# Patient Record
Sex: Female | Born: 1985 | Hispanic: Yes | Marital: Married | Smoking: Never smoker
Health system: Southern US, Community
[De-identification: ages and names within clinical notes are randomized; demographics above are authoritative.]

## PROBLEM LIST (undated history)

## (undated) ENCOUNTER — Inpatient Hospital Stay (HOSPITAL_COMMUNITY): Payer: Self-pay

## (undated) DIAGNOSIS — Z789 Other specified health status: Secondary | ICD-10-CM

## (undated) HISTORY — DX: Other specified health status: Z78.9

## (undated) HISTORY — PX: NO PAST SURGERIES: SHX2092

---

## 2018-02-16 ENCOUNTER — Other Ambulatory Visit: Payer: Self-pay

## 2018-02-16 ENCOUNTER — Inpatient Hospital Stay (HOSPITAL_COMMUNITY): Payer: Self-pay

## 2018-02-16 ENCOUNTER — Inpatient Hospital Stay (HOSPITAL_COMMUNITY)
Admission: AD | Admit: 2018-02-16 | Discharge: 2018-02-16 | Disposition: A | Discharge status: Home / Self Care | Payer: Self-pay | Source: Ambulatory Visit | Attending: Obstetrics & Gynecology | Admitting: Obstetrics & Gynecology

## 2018-02-16 ENCOUNTER — Encounter (HOSPITAL_COMMUNITY): Payer: Self-pay

## 2018-02-16 DIAGNOSIS — Z3A01 Less than 8 weeks gestation of pregnancy: Secondary | ICD-10-CM | POA: Insufficient documentation

## 2018-02-16 DIAGNOSIS — O209 Hemorrhage in early pregnancy, unspecified: Secondary | ICD-10-CM

## 2018-02-16 DIAGNOSIS — O26851 Spotting complicating pregnancy, first trimester: Secondary | ICD-10-CM | POA: Insufficient documentation

## 2018-02-16 LAB — URINALYSIS, ROUTINE W REFLEX MICROSCOPIC
BILIRUBIN URINE: NEGATIVE
Glucose, UA: NEGATIVE mg/dL
Ketones, ur: NEGATIVE mg/dL
Leukocytes, UA: NEGATIVE
NITRITE: NEGATIVE
PROTEIN: NEGATIVE mg/dL
Specific Gravity, Urine: 1.001 — ABNORMAL LOW (ref 1.005–1.030)
pH: 7 (ref 5.0–8.0)

## 2018-02-16 LAB — WET PREP, GENITAL
Clue Cells Wet Prep HPF POC: NONE SEEN
SPERM: NONE SEEN
TRICH WET PREP: NONE SEEN
YEAST WET PREP: NONE SEEN

## 2018-02-16 LAB — POCT PREGNANCY, URINE: PREG TEST UR: POSITIVE — AB

## 2018-02-16 LAB — HCG, QUANTITATIVE, PREGNANCY: hCG, Beta Chain, Quant, S: 55620 m[IU]/mL — ABNORMAL HIGH (ref <5)

## 2018-02-16 LAB — ABO/RH: ABO/RH(D): O POS

## 2018-02-16 NOTE — MAU Provider Note (Signed)
History   G1 @ apx 6 wks unsure of dates states periods are not regular. Admitted with c/o spotty amt dark vag bleeding since yesterday. States minimal amt of abd discomfort.  CSN: [id number]  Arrival date & time 02/16/18  1127   None     No chief complaint on file.   HPI  No past medical history on file.   No family history on file.  Social History   Tobacco Use  . Smoking status: Not on file  Substance Use Topics  . Alcohol use: Not on file  . Drug use: Not on file    OB History   None     Review of Systems  Constitutional: Negative.   HENT: Negative.   Eyes: Negative.   Respiratory: Negative.   Cardiovascular: Negative.   Gastrointestinal: Negative.   Endocrine: Negative.   Genitourinary: Positive for vaginal bleeding.  Musculoskeletal: Negative.   Skin: Negative.   Allergic/Immunologic: Negative.   Neurological: Negative.   Hematological: Negative.   Psychiatric/Behavioral: Negative.     Allergies  Patient has no known allergies.  Home Medications    There were no vitals taken for this visit.  Physical Exam  Constitutional: She is oriented to person, place, and time. She appears well-developed and well-nourished.  HENT:  Head: Normocephalic.  Eyes: Pupils are equal, round, and reactive to light.  Neck: Normal range of motion.  Cardiovascular: Normal rate, regular rhythm, normal heart sounds and intact distal pulses.  Pulmonary/Chest: Effort normal and breath sounds normal.  Abdominal: Soft. Bowel sounds are normal.  Genitourinary: Vagina normal and uterus normal.  Genitourinary Comments: Spotty amt vag bleeding  Musculoskeletal: Normal range of motion.  Neurological: She is alert and oriented to person, place, and time. She has normal reflexes.  Skin: Skin is warm and dry.  Psychiatric: She has a normal mood and affect. Her behavior is normal. Judgment and thought content normal.    MAU Course  Procedures (including critical care  time)  Labs Reviewed  POCT PREGNANCY, URINE - Abnormal; Notable for the following components:      Result Value   Preg Test, Ur POSITIVE (*)    All other components within normal limits  WET PREP, GENITAL  URINALYSIS, ROUTINE W REFLEX MICROSCOPIC  HCG, QUANTITATIVE, PREGNANCY  ABO/RH  GC/CHLAMYDIA PROBE AMP ([place]) NOT AT [facility]   No results found. Results for orders placed or performed during the hospital encounter of 02/16/18 (from the past 24 hour(s))  Pregnancy, urine POC     Status: Abnormal   Collection Time: 02/16/18 11:45 AM  Result Value Ref Range   Preg Test, Ur POSITIVE (A) NEGATIVE  Wet prep, genital     Status: Abnormal   Collection Time: 02/16/18 12:00 PM  Result Value Ref Range   Yeast Wet Prep HPF POC NONE SEEN NONE SEEN   Trich, Wet Prep NONE SEEN NONE SEEN   Clue Cells Wet Prep HPF POC NONE SEEN NONE SEEN   WBC, Wet Prep HPF POC FEW (A) NONE SEEN   Sperm NONE SEEN   ABO/Rh     Status: None (Preliminary result)   Collection Time: 02/16/18 12:02 PM  Result Value Ref Range   ABO/RH(D)      O POS Performed at [facility], [address] [place], [place] [address]   hCG, quantitative, pregnancy     Status: Abnormal   Collection Time: 02/16/18 12:02 PM  Result Value Ref Range   hCG, Beta Chain, Quant, S 55,620 (  H) <5 mIU/mL   No diagnosis found.    MDM  VSS, spotty amt vag bleeding on exam. abd soft and non tender. [place] shows viable IUP at 6.3 wks. U/a WNL. Will d/c home

## 2018-02-16 NOTE — MAU Note (Signed)
Pt. Complains of abdominal pain with vaginal bleeding when she wipes.  Blood is dark red.  Pt. Rates abdominal pain as 5/10.

## 2018-02-18 LAB — GC/CHLAMYDIA PROBE AMP (~~LOC~~) NOT AT ARMC
Chlamydia: NEGATIVE
NEISSERIA GONORRHEA: NEGATIVE

## 2018-04-17 ENCOUNTER — Encounter: Payer: Self-pay | Admitting: Medical

## 2018-04-19 ENCOUNTER — Other Ambulatory Visit: Payer: Self-pay | Admitting: Obstetrics and Gynecology

## 2018-04-19 ENCOUNTER — Ambulatory Visit (INDEPENDENT_AMBULATORY_CARE_PROVIDER_SITE_OTHER): Payer: Self-pay | Admitting: Obstetrics and Gynecology

## 2018-04-19 ENCOUNTER — Encounter: Payer: Self-pay | Admitting: Obstetrics and Gynecology

## 2018-04-19 VITALS — BP 124/65 | HR 71 | Wt 173.4 lb

## 2018-04-19 DIAGNOSIS — Z3400 Encounter for supervision of normal first pregnancy, unspecified trimester: Secondary | ICD-10-CM | POA: Insufficient documentation

## 2018-04-19 DIAGNOSIS — O99612 Diseases of the digestive system complicating pregnancy, second trimester: Secondary | ICD-10-CM

## 2018-04-19 DIAGNOSIS — Z113 Encounter for screening for infections with a predominantly sexual mode of transmission: Secondary | ICD-10-CM

## 2018-04-19 DIAGNOSIS — Z9889 Other specified postprocedural states: Secondary | ICD-10-CM | POA: Insufficient documentation

## 2018-04-19 DIAGNOSIS — O99619 Diseases of the digestive system complicating pregnancy, unspecified trimester: Principal | ICD-10-CM

## 2018-04-19 DIAGNOSIS — Z3402 Encounter for supervision of normal first pregnancy, second trimester: Secondary | ICD-10-CM

## 2018-04-19 DIAGNOSIS — K219 Gastro-esophageal reflux disease without esophagitis: Secondary | ICD-10-CM

## 2018-04-19 LAB — POCT URINALYSIS DIP (DEVICE)
Bilirubin Urine: NEGATIVE
Glucose, UA: NEGATIVE mg/dL
Leukocytes, UA: NEGATIVE
Nitrite: NEGATIVE
PH: 6 (ref 5.0–8.0)
PROTEIN: NEGATIVE mg/dL
Specific Gravity, Urine: 1.02 (ref 1.005–1.030)
Urobilinogen, UA: 0.2 mg/dL (ref 0.0–1.0)

## 2018-04-19 MED ORDER — RANITIDINE HCL 150 MG PO TABS: 150.0000 mg | ORAL_TABLET | Freq: Two times a day (BID) | ORAL | 0 refills | Status: DC

## 2018-04-19 NOTE — Progress Notes (Signed)
  Subjective:    Katelyn [name] is being seen today for her first obstetrical visit.  This is a planned pregnancy. She is at [redacted]w[redacted]d gestation. Her obstetrical history is significant for h/o colpo & LEEP (in January 2019). Relationship with FOB: spouse, living together. Patient does intend to breast feed. Pregnancy history fully reviewed.  Patient reports heartburn. She reports N/V is now improved.  Review of Systems:   Review of Systems  Constitutional: Negative.   HENT: Negative.   Eyes: Negative.   Respiratory: Negative.   Cardiovascular: Negative.   Gastrointestinal:       Heartburn  Endocrine: Negative.   Genitourinary: Negative.   Musculoskeletal: Negative.   Skin: Negative.   Allergic/Immunologic: Negative.   Neurological: Negative.   Hematological: Negative.   Psychiatric/Behavioral: Negative.     Objective:     BP 124/65   Pulse 71   Wt 173 lb 6.4 oz (78.7 kg)   LMP 12/28/2017   BMI 30.72 kg/m  Physical Exam  Nursing note and vitals reviewed. Constitutional: She is oriented to person, place, and time. She appears well-developed and well-nourished.  HENT:  Head: Normocephalic and atraumatic.  Eyes: Pupils are equal, round, and reactive to light.  Neck: Normal range of motion. Neck supple.  Cardiovascular: Normal rate, regular rhythm, normal heart sounds and intact distal pulses.  Respiratory: Effort normal and breath sounds normal.  GI: Soft. Bowel sounds are normal.  Neurological: She is alert and oriented to person, place, and time. She has normal reflexes.  Skin: Skin is warm and dry.  Psychiatric: She has a normal mood and affect. Her behavior is normal. Judgment and thought content normal.    Maternal Exam:  Abdomen: Patient reports no abdominal tenderness. Introitus: Normal vulva. Normal vagina.  Pelvis: adequate for delivery.   Cervix: Cervix evaluated by sterile speculum exam and digital exam.     Fetal Exam Fetal Monitor Review: Mode:  hand-held doppler probe.   Baseline rate: 153.         Assessment:    Pregnancy: G1P0000 Patient Active Problem List   Diagnosis Date Noted  . Supervision of low-risk first pregnancy 04/19/2018  . History of loop electrical excision procedure (LEEP) 04/19/2018  . History of colposcopy with cervical biopsy 04/19/2018       Plan:     Initial labs drawn. Prenatal vitamins. Problem list reviewed and updated. Plan repeat pap in July. Patient to get pap, colpo & LEEP records from [place].  Panorama & AFP3 discussed: wants to speak to spouse first. Role of ultrasound in pregnancy discussed; fetal survey: ordered. To be performed at [facility]. Patient is an [employer] participant. Amniocentesis discussed: not indicated. Follow up in 4 weeks. Setup with Babyscripts. 90% of 45 min visit spent on counseling and coordination of care.   **History, assessment, exam, lab work, and discharge home all done with in person Medical Spanish Interpreter present.    Raelyn Mora, MSN, CNM 04/19/2018

## 2018-04-22 LAB — GC/CHLAMYDIA PROBE AMP (~~LOC~~) NOT AT ARMC
CHLAMYDIA, DNA PROBE: NEGATIVE
NEISSERIA GONORRHEA: NEGATIVE

## 2018-04-22 LAB — URINE CULTURE, OB REFLEX

## 2018-04-22 LAB — CULTURE, OB URINE

## 2018-04-24 ENCOUNTER — Encounter: Payer: Self-pay | Admitting: *Deleted

## 2018-04-25 LAB — OBSTETRIC PANEL, INCLUDING HIV
ANTIBODY SCREEN: NEGATIVE
BASOS ABS: 0 10*3/uL (ref 0.0–0.2)
BASOS: 0 %
EOS (ABSOLUTE): 0 10*3/uL (ref 0.0–0.4)
Eos: 0 %
HEMATOCRIT: 37.1 % (ref 34.0–46.6)
HIV Screen 4th Generation wRfx: NONREACTIVE
Hemoglobin: 12.6 g/dL (ref 11.1–15.9)
Hepatitis B Surface Ag: NEGATIVE
IMMATURE GRANS (ABS): 0 10*3/uL (ref 0.0–0.1)
IMMATURE GRANULOCYTES: 0 %
LYMPHS: 18 %
Lymphocytes Absolute: 1.9 10*3/uL (ref 0.7–3.1)
MCH: 28.2 pg (ref 26.6–33.0)
MCHC: 34 g/dL (ref 31.5–35.7)
MCV: 83 fL (ref 79–97)
MONOCYTES: 7 %
MONOS ABS: 0.7 10*3/uL (ref 0.1–0.9)
NEUTROS PCT: 75 %
Neutrophils Absolute: 8.1 10*3/uL — ABNORMAL HIGH (ref 1.4–7.0)
Platelets: 274 10*3/uL (ref 150–450)
RBC: 4.47 x10E6/uL (ref 3.77–5.28)
RDW: 13.6 % (ref 12.3–15.4)
RPR Ser Ql: NONREACTIVE
RUBELLA: 20.7 {index} (ref >0.99)
Rh Factor: POSITIVE
WBC: 10.8 10*3/uL (ref 3.4–10.8)

## 2018-04-25 LAB — HEMOGLOBINOPATHY EVALUATION
FERRITIN: 225 ng/mL — AB (ref 15–150)
HGB SOLUBILITY: NEGATIVE
HGB VARIANT: 0 %
Hgb A2 Quant: 2.4 % (ref 1.8–3.2)
Hgb A: 97.6 % (ref 96.4–98.8)
Hgb C: 0 %
Hgb F Quant: 0 % (ref 0.0–2.0)
Hgb S: 0 %

## 2018-04-25 LAB — SMN1 COPY NUMBER ANALYSIS (SMA CARRIER SCREENING)

## 2018-04-26 ENCOUNTER — Encounter: Payer: Self-pay | Admitting: *Deleted

## 2018-05-10 ENCOUNTER — Telehealth: Payer: Self-pay | Admitting: General Practice

## 2018-05-10 NOTE — Telephone Encounter (Signed)
Per review, patient has not activated BRX yet. Called patient with pacific interpreter [phone], no answer- left message stating we are calling because we see she hasn't activated her app yet and want to make sure she isn't having problems with it. Please call us back.

## 2018-05-17 ENCOUNTER — Encounter: Payer: Self-pay | Admitting: Nurse Practitioner

## 2018-05-24 DIAGNOSIS — Z3400 Encounter for supervision of normal first pregnancy, unspecified trimester: Secondary | ICD-10-CM

## 2018-05-30 ENCOUNTER — Ambulatory Visit (INDEPENDENT_AMBULATORY_CARE_PROVIDER_SITE_OTHER): Payer: Self-pay | Admitting: Nurse Practitioner

## 2018-05-30 VITALS — BP 112/58 | HR 67 | Wt 179.7 lb

## 2018-05-30 DIAGNOSIS — Z124 Encounter for screening for malignant neoplasm of cervix: Secondary | ICD-10-CM

## 2018-05-30 DIAGNOSIS — Z9889 Other specified postprocedural states: Secondary | ICD-10-CM

## 2018-05-30 DIAGNOSIS — Z789 Other specified health status: Secondary | ICD-10-CM | POA: Insufficient documentation

## 2018-05-30 DIAGNOSIS — Z3402 Encounter for supervision of normal first pregnancy, second trimester: Secondary | ICD-10-CM

## 2018-05-30 DIAGNOSIS — Z1151 Encounter for screening for human papillomavirus (HPV): Secondary | ICD-10-CM

## 2018-05-30 DIAGNOSIS — Z113 Encounter for screening for infections with a predominantly sexual mode of transmission: Secondary | ICD-10-CM

## 2018-05-30 NOTE — Progress Notes (Signed)
    Subjective:  Katelyn [name] is a 32 y.o. G1P0000 at 7070w6d being seen today for ongoing prenatal care.  She is currently monitored for the following issues for this low-risk pregnancy and has Supervision of low-risk first pregnancy; History of loop electrical excision procedure (LEEP); and History of colposcopy with cervical biopsy on their problem list. #[id number] Kennyth Lose- [place] phone interpreter used for the interview and exam.  Patient reports no complaints.  Contractions: Not present. Vag. Bleeding: None.  Movement: Present. Denies leaking of fluid.   The following portions of the patient's history were reviewed and updated as appropriate: allergies, current medications, past family history, past medical history, past social history, past surgical history and problem list. Problem list updated.  Objective:   Vitals:   05/30/18 1130  BP: (!) 112/58  Pulse: 67  Weight: 179 lb 11.2 oz (81.5 kg)    Fetal Status: Fetal Heart Rate (bpm): 146 Fundal Height: 22 cm Movement: Present     General:  Alert, oriented and cooperative. Patient is in no acute distress.  Skin: Skin is warm and dry. No rash noted.   Cardiovascular: Normal heart rate noted  Respiratory: Normal respiratory effort, no problems with respiration noted  Abdomen: Soft, gravid, appropriate for gestational age. Pain/Pressure: Absent     Pelvic:  Cervical exam deferred      speculum exam done for pap smear.  Cervix had a small amount of bright red blood after pap.  Client informed.  Cervix has the appearance of a normal closed cervix for a primigravida - no evidence of previous LEEP  Extremities: Normal range of motion.  Edema: None  Mental Status: Normal mood and affect. Normal behavior. Normal judgment and thought content.     Assessment and Plan:  Pregnancy: G1P0000 at 7470w6d  1. Encounter for supervision of low-risk first pregnancy in second trimester Reviewed Baby Script app info that client has entered.  She  requests to come again in 8 weeks on the BabyScripts schedule but advised to call and schedule appointment if she is not doing well at home.  Reviewed panorama results with client.  [facility] - has photos but no report is seen in Media tab.  Will have staff investigate. - Cytology - PAP  2. History of loop electrical excision procedure (LEEP) No results from [place].  Cervix does not have the appearance of previous LEEP.  3.  Language barrier   Preterm labor symptoms and general obstetric precautions including but not limited to vaginal bleeding, contractions, leaking of fluid and fetal movement were reviewed in detail with the patient. Please refer to After Visit Summary for other counseling recommendations.  Return in about 1 month (around 06/27/2018).  Nolene BernheimERRI BURLESON, RN, MSN, NP-BC Nurse Practitioner, [facility] for [facility], [facility] 05/30/2018 11:51 AM

## 2018-05-30 NOTE — Patient Instructions (Signed)
Tums or Zantac heartburn

## 2018-05-30 NOTE — Progress Notes (Signed)
Pt states sometimes she has a greenish tent discharge, no odor.

## 2018-06-03 LAB — CYTOLOGY - PAP
Chlamydia: NEGATIVE
DIAGNOSIS: NEGATIVE
HPV: NOT DETECTED
NEISSERIA GONORRHEA: NEGATIVE

## 2018-07-23 ENCOUNTER — Ambulatory Visit (INDEPENDENT_AMBULATORY_CARE_PROVIDER_SITE_OTHER): Payer: Self-pay | Admitting: Nurse Practitioner

## 2018-07-23 ENCOUNTER — Other Ambulatory Visit: Payer: Self-pay

## 2018-07-23 ENCOUNTER — Encounter: Payer: Self-pay | Admitting: Nurse Practitioner

## 2018-07-23 VITALS — BP 115/71 | HR 64 | Wt 193.1 lb

## 2018-07-23 DIAGNOSIS — Z9889 Other specified postprocedural states: Secondary | ICD-10-CM

## 2018-07-23 DIAGNOSIS — Z3400 Encounter for supervision of normal first pregnancy, unspecified trimester: Secondary | ICD-10-CM

## 2018-07-23 DIAGNOSIS — Z789 Other specified health status: Secondary | ICD-10-CM

## 2018-07-23 DIAGNOSIS — Z3403 Encounter for supervision of normal first pregnancy, third trimester: Secondary | ICD-10-CM

## 2018-07-23 NOTE — Progress Notes (Signed)
    Subjective:  Katelyn [name] is a 32 y.o. G1P0000 at [redacted]w[redacted]d being seen today for ongoing prenatal care.  She is currently monitored for the following issues for this low-risk pregnancy and has Supervision of low-risk first pregnancy; History of loop electrical excision procedure (LEEP); History of colposcopy with cervical biopsy; and Language barrier on their problem list. Spansh interpreter present for the entire visit.  Patient reports occasional cramping once or twice a week..  Contractions: Not present. Vag. Bleeding: None.  Movement: Present. Denies leaking of fluid.   The following portions of the patient's history were reviewed and updated as appropriate: allergies, current medications, past family history, past medical history, past social history, past surgical history and problem list. Problem list updated.  Objective:   Vitals:   07/23/18 0830  BP: 115/71  Pulse: 64  Weight: 193 lb 1.6 oz (87.6 kg)    Fetal Status: Fetal Heart Rate (bpm): 146 Fundal Height: 30 cm Movement: Present     General:  Alert, oriented and cooperative. Patient is in no acute distress.  Skin: Skin is warm and dry. No rash noted.   Cardiovascular: Normal heart rate noted  Respiratory: Normal respiratory effort, no problems with respiration noted  Abdomen: Soft, gravid, appropriate for gestational age. Pain/Pressure: Present     Pelvic:  Cervical exam deferred        Extremities: Normal range of motion.  Edema: Trace  Mental Status: Normal mood and affect. Normal behavior. Normal judgment and thought content.   Urinalysis:      Assessment and Plan:  Pregnancy: G1P0000 at [redacted]w[redacted]d  1. Encounter for supervision of low-risk first pregnancy, antepartum glucola being done today Discussed childbirth classes and tour Will investigate getting TDAP at [facility] due to cost. Babyscripts vitals reviewed - normal Discussed weight gain of 11-20 pounds over prepregnancy weight - lost weight  initally Encouraged exercise Advised to come for an additional visit if the cramping is increasing Drink at least 8 8-oz glasses of water every day.  2. Language barrier Interpreter present for entire visit.  3. History of loop electrical excision procedure (LEEP) Pap result reviewed - normal  Preterm labor symptoms and general obstetric precautions including but not limited to vaginal bleeding, contractions, leaking of fluid and fetal movement were reviewed in detail with the patient. Please refer to After Visit Summary for other counseling recommendations.  Return in about 3 weeks (around 08/13/2018).  Nolene Bernheim, RN, MSN, NP-BC Nurse Practitioner, [facility] for [employer], [facility] 07/23/2018 9:09 AM

## 2018-07-23 NOTE — Patient Instructions (Addendum)
Take Tylenol 325 mg 2 tablets by mouth every 4 hours if needed for pain. Acetaminophen Drink at least 8 8-oz glasses of water every day.  Call the Health Department and see if the TDAP injection is less there.  Call the immunizations.  [phone].   Childbirth Education Options: [facility] Department Classes:  Childbirth education classes can help you get ready for a positive parenting experience. You can also meet other expectant parents and get free stuff for your baby. Each class runs for five weeks on the same night and costs $45 for the mother-to-be and her support person. Medicaid covers the cost if you are eligible. Call [phone] to register. [facility] Childbirth Education:  [phone] or [phone] or [email or web address]  Baby & Me Class: Discuss newborn & infant parenting and family adjustment issues with other new mothers in a relaxed environment. Each week brings a new speaker or baby-centered activity. We encourage new mothers to join [place] every Thursday at 11:00am. Babies birth until crawling. No registration or fee. Daddy [employer]: This course offers Dads-to-be the tools and knowledge needed to feel confident on their journey to becoming new fathers. Experienced dads, who have been trained as coaches, teach dads-to-be how to hold, comfort, diaper, swaddle and play with their infant while being able to support the new mom as well. A class for men taught by men. $25/dad Big Brother/Big Sister: Let your children share in the joy of a new brother or sister in this special class designed just for them. Class includes discussion about how families care for babies: swaddling, holding, diapering, safety as well as how they can be helpful in their new role. This class is designed for children ages 34 to 40, but any age is welcome. Please register each child individually. $5/child  Mom Talk: This mom-led group offers support and connection to mothers as they journey  through the adjustments and struggles of that sometimes overwhelming first year after the birth of a child. Tuesdays at 10:00am and Thursdays at 6:00pm. Babies welcome. No registration or fee. Breastfeeding Support Group: This group is a mother-to-mother support circle where moms have the opportunity to share their breastfeeding experiences. A Lactation Consultant is present for questions and concerns. Meets each Tuesday at 11:00am. No fee or registration. Breastfeeding Your Baby: Learn what to expect in the first days of breastfeeding your newborn.  This class will help you feel more confident with the skills needed to begin your breastfeeding experience. Many new mothers are concerned about breastfeeding after leaving the hospital. This class will also address the most common fears and challenges about breastfeeding during the first few weeks, months and beyond. (call for fee) Comfort Techniques and Tour: This 2 hour interactive class will provide you the opportunity to learn & practice hands-on techniques that can help relieve some of the discomfort of labor and encourage your baby to rotate toward the best position for birth. You and your partner will be able to try a variety of labor positions with birth balls and rebozos as well as practice breathing, relaxation, and visualization techniques. A tour of the [facility] is included with this class. $20 per registrant and support person Childbirth Class- Weekend Option: This class is a Weekend version of our Birth & Baby series. It is designed for parents who have a difficult time fitting several weeks of classes into their schedule. It covers the care of your newborn and the basics of labor and childbirth. It also includes  a [place] [facility] and lunch. The class is held two consecutive days: beginning on Friday evening from 6:30 - 8:30 p.m. and the next day, Saturday from 9 a.m. - 4 p.m. (call for  fee) Doren Custard Class: Interested in a waterbirth?  This informational class will help you discover whether waterbirth is the right fit for you. Education about waterbirth itself, supplies you would need and how to assemble your support team is what you can expect from this class. Some obstetrical practices require this class in order to pursue a waterbirth. (Not all obstetrical practices offer waterbirth-check with your healthcare provider.) Register only the expectant mom, but you are encouraged to bring your partner to class! Required if planning waterbirth, no fee. Infant/Child CPR: Parents, grandparents, babysitters, and friends learn Cardio-Pulmonary Resuscitation skills for infants and children. You will also learn how to treat both conscious and unconscious choking in infants and children. This Family & Friends program does not offer certification. Register each participant individually to ensure that enough mannequins are available. (Call for fee) Grandparent Love: Expecting a grandbaby? This class is for you! Learn about the latest infant care and safety recommendations and ways to support your own child as he or she transitions into the parenting role. Taught by Registered Nurses who are childbirth instructors, but most importantly...they are grandmothers too! $10/person. Childbirth Class- Natural Childbirth: This series of 5 weekly classes is for expectant parents who want to learn and practice natural methods of coping with the process of labor and childbirth. Relaxation, breathing, massage, visualization, role of the partner, and helpful positioning are highlighted. Participants learn how to be confident in their body's ability to give birth. This class will empower and help parents make informed decisions about their own care. Includes discussion that will help new parents transition into the immediate postpartum period. [facility] is included. We suggest taking  this class between 25-32 weeks, but it's only a recommendation. $75 per registrant and one support person or $30 Medicaid. Childbirth Class- 3 week Series: This option of 3 weekly classes helps you and your labor partner prepare for childbirth. Newborn care, labor & birth, cesarean birth, pain management, and comfort techniques are discussed and a [place] [facility] is included. The class meets at the same time, on the same day of the week for 3 consecutive weeks beginning with the starting date you choose. $60 for registrant and one support person.  Marvelous Multiples: Expecting twins, triplets, or more? This class covers the differences in labor, birth, parenting, and breastfeeding issues that face multiples' parents. NICU tour is included. Led by a Certified Childbirth Educator who is the mother of twins. No fee. Caring for Baby: This class is for expectant and adoptive parents who want to learn and practice the most up-to-date newborn care for their babies. Focus is on birth through the first six weeks of life. Topics include feeding, bathing, diapering, crying, umbilical cord care, circumcision care and safe sleep. Parents learn to recognize symptoms of illness and when to call the pediatrician. Register only the mom-to-be and your partner or support person can plan to come with you! $10 per registrant and support person Childbirth Class- online option: This online class offers you the freedom to complete a Birth and Baby series in the comfort of your own home. The flexibility of this option allows you to review sections at your own pace, at times convenient to you and your  support people. It includes additional video information, animations, quizzes, and extended activities. Get organized with helpful eClass tools, checklists, and trackers. Once you register online for the class, you will receive an email within a few days to accept the invitation and begin the class when the  time is right for you. The content will be available to you for 60 days. $60 for 60 days of online access for you and your support people.  Local Doulas: Natural Baby Doulas [email or web address] Tel: [phone] [email or web address] [employer] [phone] [email or web address] The Labor Hassell Halim  (also do waterbirth tub rental) [phone] [email or web address] [facility]ad Birth Doula [phone] [email or web address].fi Sacred Rhythms  [phone] [email or web address]://[email or web address]-[email or web address]/ [employer] (PADA) [email or web address] [place] Birth [facility] Baby  [email or web address] Considering Waterbirth? Guide for patients at [facility] [employer]  Why consider waterbirth?  . Gentle birth for babies . Less pain medicine used in labor . May allow for passive descent/less pushing . May reduce perineal tears  . More mobility and instinctive maternal position changes . Increased maternal relaxation . Reduced blood pressure in labor  Is waterbirth safe? What are the risks of infection, drowning or other complications?  . Infection: o Very low risk (3.7 % for tub vs 4.8% for bed) o 7 in 8000 waterbirths with documented infection o Poorly cleaned equipment most common cause o Slightly lower group B strep transmission rate  . Drowning o Maternal:  - Very low risk   - Related to seizures or fainting o Newborn:  - Very low risk. No evidence of increased risk of respiratory problems in multiple large studies - Physiological protection from breathing under water - Avoid underwater birth if there are any fetal complications - Once baby's head is out of the water, keep it out.  . Birth complication o Some reports of cord trauma, but risk decreased by bringing baby to surface gradually o No evidence  of increased risk of shoulder dystocia. Mothers can usually change positions faster in water than in a bed, possibly aiding the maneuvers to free the shoulder.   You must attend a Doren Custard class at [facility]  3rd Wednesday of every month from 7-9pm  [employer] by calling [phone] or online at [email or web address]  Bring [place] the certificate from the class to your prenatal appointment  Meet with a midwife at 36 weeks to see if you can still plan a waterbirth and to sign the consent.   Purchase or rent the following supplies:   Water Birth Pool (Birth Pool in a Box or [place] for instance)  (Tubs start ~$125)  Single-use disposable tub liner designed for your brand of tub  New garden hose labeled "lead-free", "suitable for drinking water",  Electric drain pump to remove water (We recommend 792 gallon per hour or greater pump.)   Separate garden hose to remove the dirty water  Fish net  Bathing suit top (optional)  Long-handled mirror (optional)  Places to purchase or rent supplies  GotWebTools.is for tub purchases and supplies  Waterbirthsolutions.com for tub purchases and supplies  The Labor Ladies (www.[email or web address]) $275 for tub rental/set-up & take down/kit   [employer] (http://www.fleming.com/.htm) Information regarding doulas (labor support) who provide pool rentals  Our practice has a Birth Pool in a Box tub at the hospital that you may borrow on a first-come-first-served basis. It is your responsibility to to set up, clean and break down the tub. We cannot guarantee the availability of this tub in advance. You are responsible  for bringing all accessories listed above. If you do not have all necessary supplies you cannot have a waterbirth.    Things that would prevent you from having a waterbirth:  Premature, <37wks  Previous cesarean birth  Presence of thick meconium-stained fluid  Multiple gestation  (Twins, triplets, etc.)  Uncontrolled diabetes or gestational diabetes requiring medication  Hypertension requiring medication or diagnosis of pre-eclampsia  Heavy vaginal bleeding  Non-reassuring fetal heart rate  Active infection (MRSA, etc.). Group B Strep is NOT a contraindication for  waterbirth.  If your labor has to be induced and induction method requires continuous  monitoring of the baby's heart rate  Other risks/issues identified by your obstetrical provider  Please remember that birth is unpredictable. Under certain unforeseeable circumstances your provider may advise against giving birth in the tub. These decisions will be made on a case-by-case basis and with the safety of you and your baby as our highest priority.

## 2018-07-24 LAB — CBC
HEMATOCRIT: 37 % (ref 34.0–46.6)
Hemoglobin: 12.3 g/dL (ref 11.1–15.9)
MCH: 29.1 pg (ref 26.6–33.0)
MCHC: 33.2 g/dL (ref 31.5–35.7)
MCV: 88 fL (ref 79–97)
PLATELETS: 265 10*3/uL (ref 150–450)
RBC: 4.22 x10E6/uL (ref 3.77–5.28)
RDW: 12.5 % (ref 12.3–15.4)
WBC: 11.8 10*3/uL — AB (ref 3.4–10.8)

## 2018-07-24 LAB — GLUCOSE TOLERANCE, 2 HOURS W/ 1HR
GLUCOSE, 1 HOUR: 95 mg/dL (ref 65–179)
GLUCOSE, 2 HOUR: 81 mg/dL (ref 65–152)
GLUCOSE, FASTING: 68 mg/dL (ref 65–91)

## 2018-07-24 LAB — HIV ANTIBODY (ROUTINE TESTING W REFLEX): HIV Screen 4th Generation wRfx: NONREACTIVE

## 2018-07-24 LAB — RPR: RPR Ser Ql: NONREACTIVE

## 2018-07-29 ENCOUNTER — Encounter: Payer: Self-pay | Admitting: *Deleted

## 2018-08-13 ENCOUNTER — Ambulatory Visit (INDEPENDENT_AMBULATORY_CARE_PROVIDER_SITE_OTHER): Payer: Self-pay | Admitting: Student

## 2018-08-13 DIAGNOSIS — Z3400 Encounter for supervision of normal first pregnancy, unspecified trimester: Secondary | ICD-10-CM

## 2018-08-13 NOTE — Patient Instructions (Signed)

## 2018-08-13 NOTE — Progress Notes (Signed)
   PRENATAL VISIT NOTE  Subjective:  Katelyn [name] is a 32 y.o. G1P0000 at 1877w4d being seen today for ongoing prenatal care.  She is currently monitored for the following issues for this low-risk pregnancy and has Supervision of low-risk first pregnancy; History of loop electrical excision procedure (LEEP); History of colposcopy with cervical biopsy; and Language barrier on their problem list.  Patient reports no complaints.  Contractions: Not present. Vag. Bleeding: None.  Movement: Present. Denies leaking of fluid.   The following portions of the patient's history were reviewed and updated as appropriate: allergies, current medications, past family history, past medical history, past social history, past surgical history and problem list. Problem list updated.  Objective:   Vitals:   08/13/18 1049  BP: 130/71  Pulse: 72  Weight: 198 lb 4.8 oz (89.9 kg)    Fetal Status: Fetal Heart Rate (bpm): 154 Fundal Height: 32 cm Movement: Present     General:  Alert, oriented and cooperative. Patient is in no acute distress.  Skin: Skin is warm and dry. No rash noted.   Cardiovascular: Normal heart rate noted  Respiratory: Normal respiratory effort, no problems with respiration noted  Abdomen: Soft, gravid, appropriate for gestational age.  Pain/Pressure: Absent     Pelvic: Cervical exam deferred        Extremities: Normal range of motion.  Edema: None  Mental Status: Normal mood and affect. Normal behavior. Normal judgment and thought content.   Assessment and Plan:  Pregnancy: G1P0000 at 777w4d  1. Encounter for supervision of low-risk first pregnancy, antepartum -Doing well; planning to breastfeed -Discussed Nexplanon for birth control -Patient says that anatomy scan was normal; images not in patient's chart. CMA will call to find out to request US report.   Preterm labor symptoms and general obstetric precautions including but not limited to vaginal bleeding, contractions,  leaking of fluid and fetal movement were reviewed in detail with the patient. Please refer to After Visit Summary for other counseling recommendations.  Return in about 4 weeks (around 09/10/2018), or LROB.  No future appointments.  Marylene LandKathryn Lorraine Kooistra, CNM

## 2018-09-10 ENCOUNTER — Other Ambulatory Visit (HOSPITAL_COMMUNITY)
Admission: RE | Admit: 2018-09-10 | Discharge: 2018-09-10 | Disposition: A | Discharge status: Home / Self Care | Payer: Medicaid Other | Source: Ambulatory Visit | Attending: Student | Admitting: Student

## 2018-09-10 ENCOUNTER — Ambulatory Visit (INDEPENDENT_AMBULATORY_CARE_PROVIDER_SITE_OTHER): Payer: Self-pay | Admitting: Student

## 2018-09-10 DIAGNOSIS — Z3400 Encounter for supervision of normal first pregnancy, unspecified trimester: Secondary | ICD-10-CM

## 2018-09-10 DIAGNOSIS — Z3403 Encounter for supervision of normal first pregnancy, third trimester: Secondary | ICD-10-CM | POA: Insufficient documentation

## 2018-09-10 NOTE — Patient Instructions (Signed)
Braxton Hicks Contractions °Contractions of the uterus can occur throughout pregnancy, but they are not always a sign that you are in labor. You may have practice contractions called Braxton Hicks contractions. These false labor contractions are sometimes confused with true labor. °What are Braxton Hicks contractions? °Braxton Hicks contractions are tightening movements that occur in the muscles of the uterus before labor. Unlike true labor contractions, these contractions do not result in opening (dilation) and thinning of the cervix. Toward the end of pregnancy (32-34 weeks), Braxton Hicks contractions can happen more often and may become stronger. These contractions are sometimes difficult to tell apart from true labor because they can be very uncomfortable. You should not feel embarrassed if you go to the hospital with false labor. °Sometimes, the only way to tell if you are in true labor is for your health care provider to look for changes in the cervix. The health care provider will do a physical exam and may monitor your contractions. If you are not in true labor, the exam should show that your cervix is not dilating and your water has not broken. °If there are other health problems associated with your pregnancy, it is completely safe for you to be sent home with false labor. You may continue to have Braxton Hicks contractions until you go into true labor. °How to tell the difference between true labor and false labor °True labor °· Contractions last 30-70 seconds. °· Contractions become very regular. °· Discomfort is usually felt in the top of the uterus, and it spreads to the lower abdomen and low back. °· Contractions do not go away with walking. °· Contractions usually become more intense and increase in frequency. °· The cervix dilates and gets thinner. °False labor °· Contractions are usually shorter and not as strong as true labor contractions. °· Contractions are usually irregular. °· Contractions  are often felt in the front of the lower abdomen and in the groin. °· Contractions may go away when you walk around or change positions while lying down. °· Contractions get weaker and are shorter-lasting as time goes on. °· The cervix usually does not dilate or become thin. °Follow these instructions at home: °· Take over-the-counter and prescription medicines only as told by your health care provider. °· Keep up with your usual exercises and follow other instructions from your health care provider. °· Eat and drink lightly if you think you are going into labor. °· If Braxton Hicks contractions are making you uncomfortable: °? Change your position from lying down or resting to walking, or change from walking to resting. °? Sit and rest in a tub of warm water. °? Drink enough fluid to keep your urine pale yellow. Dehydration may cause these contractions. °? Do slow and deep breathing several times an hour. °· Keep all follow-up prenatal visits as told by your health care provider. This is important. °Contact a health care provider if: °· You have a fever. °· You have continuous pain in your abdomen. °Get help right away if: °· Your contractions become stronger, more regular, and closer together. °· You have fluid leaking or gushing from your vagina. °· You pass blood-tinged mucus (bloody show). °· You have bleeding from your vagina. °· You have low back pain that you never had before. °· You feel your baby’s head pushing down and causing pelvic pressure. °· Your baby is not moving inside you as much as it used to. °Summary °· Contractions that occur before labor are called Braxton   Hicks contractions, false labor, or practice contractions. °· Braxton Hicks contractions are usually shorter, weaker, farther apart, and less regular than true labor contractions. True labor contractions usually become progressively stronger and regular and they become more frequent. °· Manage discomfort from Braxton Hicks contractions by  changing position, resting in a warm bath, drinking plenty of water, or practicing deep breathing. °This information is not intended to replace advice given to you by your health care provider. Make sure you discuss any questions you have with your health care provider. °Document Released: 03/22/2017 Document Revised: 03/22/2017 Document Reviewed: 03/22/2017 °Elsevier Interactive Patient Education © 2018 Elsevier Inc. ° °

## 2018-09-10 NOTE — Progress Notes (Signed)
   PRENATAL VISIT NOTE  Subjective:  Katelyn [name] is a 32 y.o. G1P0000 at [redacted]w[redacted]d being seen today for ongoing prenatal care.  She is currently monitored for the following issues for this low-risk pregnancy and has Supervision of low-risk first pregnancy; History of loop electrical excision procedure (LEEP); History of colposcopy with cervical biopsy; and Language barrier on their problem list.  Patient reports no complaints.  Contractions: Irritability. Vag. Bleeding: None.  Movement: Present. Denies leaking of fluid.   The following portions of the patient's history were reviewed and updated as appropriate: allergies, current medications, past family history, past medical history, past social history, past surgical history and problem list. Problem list updated.  Objective:   Vitals:   09/10/18 1450  BP: 121/70  Pulse: 68  Weight: 206 lb 6.4 oz (93.6 kg)    Fetal Status: Fetal Heart Rate (bpm): 144   Movement: Present     General:  Alert, oriented and cooperative. Patient is in no acute distress.  Skin: Skin is warm and dry. No rash noted.   Cardiovascular: Normal heart rate noted  Respiratory: Normal respiratory effort, no problems with respiration noted  Abdomen: Soft, gravid, appropriate for gestational age.  Pain/Pressure: Present     Pelvic: Cervical exam performed vertex        Extremities: Normal range of motion.  Edema: Trace  Mental Status: Normal mood and affect. Normal behavior. Normal judgment and thought content.   Assessment and Plan:  Pregnancy: G1P0000 at [redacted]w[redacted]d  1. Encounter for supervision of low-risk first pregnancy, antepartum -Doing well; discussed warning signs of labor vs. Braxton hicks.  -will bring name of pediatrician - GC/Chlamydia probe amp ([place])not at [facility] - Culture, beta strep (group b only)  Term labor symptoms and general obstetric precautions including but not limited to vaginal bleeding, contractions, leaking of fluid and fetal  movement were reviewed in detail with the patient. Please refer to After Visit Summary for other counseling recommendations.  Return in about 2 weeks (around 09/24/2018), or LROB.  Future Appointments  Date Time Provider Department Center  09/24/2018  1:15 PM Gwenevere Abbot, MD [facility]    [address] [place], [place]

## 2018-09-11 LAB — GC/CHLAMYDIA PROBE AMP (~~LOC~~) NOT AT ARMC
Chlamydia: NEGATIVE
Neisseria Gonorrhea: NEGATIVE

## 2018-09-13 LAB — CULTURE, BETA STREP (GROUP B ONLY): Strep Gp B Culture: NEGATIVE

## 2018-09-24 ENCOUNTER — Ambulatory Visit (INDEPENDENT_AMBULATORY_CARE_PROVIDER_SITE_OTHER): Payer: Self-pay | Admitting: Family Medicine

## 2018-09-24 VITALS — BP 119/71 | HR 72 | Wt 209.2 lb

## 2018-09-24 DIAGNOSIS — Z3400 Encounter for supervision of normal first pregnancy, unspecified trimester: Secondary | ICD-10-CM

## 2018-09-24 NOTE — Progress Notes (Signed)
   PRENATAL VISIT NOTE  Subjective:  Katelyn [name] is a 32 y.o. G1P0000 at [redacted]w[redacted]d being seen today for ongoing prenatal care.  She is currently monitored for the following issues for this low-risk pregnancy and has Supervision of low-risk first pregnancy; History of loop electrical excision procedure (LEEP); History of colposcopy with cervical biopsy; and Language barrier on their problem list.  Patient reports no complaints.  Contractions: Irregular. Vag. Bleeding: None.  Movement: Present. Denies leaking of fluid.   The following portions of the patient's history were reviewed and updated as appropriate: allergies, current medications, past family history, past medical history, past social history, past surgical history and problem list. Problem list updated.  Objective:   Vitals:   09/24/18 1320  BP: 119/71  Pulse: 72  Weight: 209 lb 3.2 oz (94.9 kg)    Fetal Status: Fetal Heart Rate (bpm): 140 Fundal Height: 36 cm Movement: Present     General:  Alert, oriented and cooperative. Patient is in no acute distress.  Skin: Skin is warm and dry. No rash noted.   Cardiovascular: Normal heart rate noted  Respiratory: Normal respiratory effort, no problems with respiration noted  Abdomen: Soft, gravid, appropriate for gestational age.  Pain/Pressure: Present     Pelvic: Cervical exam performed Dilation: Closed Effacement (%): Thick Station: -3  Extremities: Normal range of motion.  Edema: None  Mental Status: Normal mood and affect. Normal behavior. Normal judgment and thought content.   Assessment and Plan:  Pregnancy: G1P0000 at [redacted]w[redacted]d  1. Encounter for supervision of low-risk first pregnancy, antepartum - Doing well - Follow up in 1 week  Term labor symptoms and general obstetric precautions including but not limited to vaginal bleeding, contractions, leaking of fluid and fetal movement were reviewed in detail with the patient. Please refer to After Visit Summary for other  counseling recommendations.  Return in about 1 week (around 10/01/2018) for ROB.  No future appointments.  Gwenevere Abbot, MD

## 2018-09-24 NOTE — Progress Notes (Signed)
3.6 °

## 2018-09-24 NOTE — Patient Instructions (Signed)
Evaluacin de los movimientos fetales Fetal Movement Counts Introduccin Nombre del paciente: ________________________________________________ Micheline Chapman estimada: ____________________ Young Berry evaluacin de los movimientos fetales? Una evaluacin de los movimientos fetales es el registro del nmero de veces que siente que el beb se mueve durante un cierto perodo de [place]. Esto tambin se puede denominar recuento de patadas fetales. Una evaluacin de movimientos fetales se recomienda a todas las embarazadas. Es posible que le indiquen que comience a Development worker, community los movimientos fetales desde la semana 28 de [place]. Preste atencin cuando sienta que el beb est ms activo. Podr detectar los ciclos en que el beb duerme y est despierto. Tambin podr detectar que ciertas cosas hacen que su beb se mueva ms. Deber realizar una evaluacin de los movimientos fetales en las siguientes situaciones:  Cuando el beb est ms activo habitualmente.  A la [employer], todos los [place].  Un buen momento para evaluar los movimientos fetales es cuando est descansando, despus de haber comido y bebido algo. Cmo debo contar los movimientos fetales? 1. Encuentre un lugar tranquilo y cmodo. Sintese o acustese de lado. 2. Anote la fecha, la hora de inicio y de finalizacin y la cantidad de movimientos que sinti entre esas dos horas. Lleve esta informacin a las visitas de control. 3. Cuente las pataditas, revoloteos, chasquidos, vueltas o pinchazos en un perodo de 2horas. Debe sentir al menos en 2horas. 4. Cuando sienta , puede dejar de contar. 5. Si no siente en 2horas, coma y beba algo. Luego, contine descansando y Industrial/product designer. Si siente al menos durante esa hora, puede dejar de contar. Comunquese con un mdico si:  Siente menos de en 2horas.  El beb no se mueve tanto como suele hacerlo. Fecha:  ____________ Stevan Born inicio: ____________ Stevan Born finalizacin: ____________ Movimientos: ____________ Franco Nones: ____________ Stevan Born inicio: ____________ Stevan Born finalizacin: ____________ Movimientos: ____________ Franco Nones: ____________ Stevan Born inicio: ____________ Stevan Born finalizacin: ____________ Movimientos: ____________ Franco Nones: ____________ Stevan Born inicio: ____________ Stevan Born finalizacin: ____________ Movimientos: ____________ Franco Nones: ____________ Stevan Born inicio: ____________ Mammie Russian de finalizacin: ____________ Movimientos: ____________ Franco Nones: ____________ Stevan Born inicio: ____________ Mammie Russian de finalizacin: ____________ Movimientos: ____________ Franco Nones: ____________ Stevan Born inicio: ____________ Mammie Russian de finalizacin: ____________ Movimientos: ____________ Franco Nones: ____________ Stevan Born inicio: ____________ Stevan Born finalizacin: ____________ Movimientos: ____________ Franco Nones: ____________ Stevan Born inicio: ____________ Mammie Russian de finalizacin: ____________ Movimientos: ____________ Esta informacin no tiene como fin reemplazar el consejo del mdico. Asegrese de hacerle al mdico cualquier pregunta que tenga. Document Released: 02/13/2008 Document Revised: 02/09/2017 Document Reviewed: 12/16/2015 Elsevier Interactive Patient Education  2018 [employer]. Systems analyst trimestre de Psychiatrist (Third Trimester of Pregnancy) El tercer trimestre comprende desde la semana29 hasta la semana42, es decir, desde el mes7 hasta el [address] En este trimestre, el feto crece muy rpido. Hacia el final del noveno mes, el feto mide alrededor de 20pulgadas (45cm) de largo y pesa entre 6y 10libras [phone]). CUIDADOS EN EL HOGAR  No fume, no consuma hierbas ni beba alcohol. No tome frmacos que el mdico no haya autorizado.  No consuma ningn producto que contenga tabaco, lo que incluye cigarrillos, tabaco de Theatre manager o Administrator, Civil Service. Si necesita ayuda para dejar de fumar, consulte al [employer]. Puede recibir  asesoramiento u otro tipo de apoyo para dejar de fumar.  Tome los medicamentos solamente como se lo haya indicado el mdico. Algunos medicamentos son seguros para tomar durante el Psychiatrist y otros no lo son.  Haga ejercicios solamente como se lo  haya indicado el mdico. Interrumpa la actividad fsica si comienza a tener calambres.  Ingiera alimentos saludables de Reader regular.  Use un sostn que le brinde buen soporte si sus mamas estn sensibles.  No se d baos de inmersin en agua caliente, baos turcos ni saunas.  Colquese el cinturn de seguridad cuando conduzca.  No coma carne cruda ni queso sin cocinar; evite el contacto con las bandejas sanitarias de los gatos y la tierra que estos animales usan.  Tome las vitaminas prenatales.  Tome entre 1500 y 2000mg  de calcio diariamente comenzando en la semana20 del embarazo [place].  Pruebe tomar un medicamento que la ayude a defecar (un laxante suave) si el mdico lo autoriza. Consuma ms fibra, que se encuentra en las frutas y verduras frescas y los cereales integrales. Beba suficiente lquido para mantener el pis (orina) claro o de color amarillo plido.  Dese baos de asiento con agua tibia para Engineer, materials o las molestias causadas por las hemorroides. Use una crema para las hemorroides si el mdico la autoriza.  Si se le hinchan las venas (venas varicosas), use medias de descanso. Levante (eleve) los pies durante , 3 o 4veces por Futures trader. Limite el consumo de sal en su dieta.  No levante objetos pesados, use zapatos de tacones bajos y sintese derecha.  Descanse con las piernas elevadas si tiene calambres o dolor de cintura.  Visite a su dentista si no lo ha Occupational hygienist. Use un cepillo de cerdas suaves para cepillarse los dientes. Psese el hilo dental con suavidad.  Puede seguir [employer], a menos que el mdico le indique lo contrario.  No haga viajes de larga distancia,  excepto si es obligatorio y solamente con la aprobacin del mdico.  Tome clases prenatales.  Practique ir manejando al hospital.  Prepare el bolso que llevar al hospital.  Prepare la habitacin del beb.  Concurra a los controles mdicos.  SOLICITE AYUDA SI:  No est segura de si est en trabajo de parto o si ha roto la bolsa de las aguas.  Tiene mareos.  Siente calambres leves o presin en la parte inferior del abdomen.  Sufre un dolor persistente en el abdomen.  Tiene Programme researcher, broadcasting/film/video (nuseas), vmitos, o tiene deposiciones acuosas (diarrea).  Advierte un olor ftido que proviene de la vagina.  Siente dolor al [employer].  SOLICITE AYUDA DE INMEDIATO SI:  Tiene fiebre.  Tiene una prdida de lquido por la vagina.  Tiene sangrado o pequeas prdidas vaginales.  Siente dolor intenso o clicos en el abdomen.  Sube o baja de peso rpidamente.  Tiene dificultades para recuperar el aliento y siente dolor en el pecho.  Sbitamente se le hinchan mucho el rostro, las [place], los tobillos, los pies o las piernas.  No ha sentido los movimientos del beb durante Georgianne Fick.  Siente un dolor de cabeza intenso que no se alivia con medicamentos.  Su visin se modifica.  Esta informacin no tiene Theme park manager el consejo del mdico. Asegrese de hacerle al mdico cualquier pregunta que tenga. Document Released: 07/09/2013 Document Revised: 11/27/2014 Document Reviewed: 01/07/2013 Elsevier Interactive Patient Education  2017 [employer].

## 2018-10-01 ENCOUNTER — Other Ambulatory Visit: Payer: Self-pay | Admitting: Student

## 2018-10-01 ENCOUNTER — Ambulatory Visit (INDEPENDENT_AMBULATORY_CARE_PROVIDER_SITE_OTHER): Payer: Self-pay | Admitting: Student

## 2018-10-01 DIAGNOSIS — Z3400 Encounter for supervision of normal first pregnancy, unspecified trimester: Secondary | ICD-10-CM

## 2018-10-01 DIAGNOSIS — Z3403 Encounter for supervision of normal first pregnancy, third trimester: Secondary | ICD-10-CM

## 2018-10-01 NOTE — Progress Notes (Signed)
   PRENATAL VISIT NOTE  Subjective:  Katelyn [name] is a 32 y.o. G1P0000 at [redacted]w[redacted]d being seen today for ongoing prenatal care.  She is currently monitored for the following issues for this low-risk pregnancy and has Supervision of low-risk first pregnancy; History of loop electrical excision procedure (LEEP); History of colposcopy with cervical biopsy; and Language barrier on their problem list.  Patient reports no complaints.  Contractions: Irritability. Vag. Bleeding: None.  Movement: Present. Denies leaking of fluid.   The following portions of the patient's history were reviewed and updated as appropriate: allergies, current medications, past family history, past medical history, past social history, past surgical history and problem list. Problem list updated.  Objective:   Vitals:   10/01/18 1403  BP: 120/67  Pulse: 65  Weight: 214 lb 4.8 oz (97.2 kg)    Fetal Status: Fetal Heart Rate (bpm): 143   Movement: Present     General:  Alert, oriented and cooperative. Patient is in no acute distress.  Skin: Skin is warm and dry. No rash noted.   Cardiovascular: Normal heart rate noted  Respiratory: Normal respiratory effort, no problems with respiration noted  Abdomen: Soft, gravid, appropriate for gestational age.  Pain/Pressure: Present     Pelvic: Cervical exam deferred        Extremities: Normal range of motion.  Edema: Trace  Mental Status: Normal mood and affect. Normal behavior. Normal judgment and thought content.   Assessment and Plan:  Pregnancy: G1P0000 at [redacted]w[redacted]d  1. Encounter for supervision of low-risk first pregnancy, antepartum -Reviewed warning signs of labor; discussed induction methods, not FB candidate based on vaginal exam.   Term labor symptoms and general obstetric precautions including but not limited to vaginal bleeding, contractions, leaking of fluid and fetal movement were reviewed in detail with the patient. Please refer to After Visit Summary for  other counseling recommendations.  Return LROB NST and AFI next week on 20 or 21.  Future Appointments  Date Time Provider Department Center  10/08/2018  1:35 PM Marylene Land, CNM [facility] WOC  10/10/2018  1:15 PM WOC-WOCA NST WOC-[facility] WOC  10/11/2018  7:30 AM WH-BSSCHED ROOM WH-BSSCHED None    Marylene Land, [place]

## 2018-10-01 NOTE — Patient Instructions (Signed)
Induccin del trabajo de parto  (Labor Induction)  Se denomina induccin del trabajo de parto cuando se inician acciones para hacer que una mujer embarazada comience el trabajo de parto. La mayora de las mujeres comienzan el trabajo de parto sin ayuda entre las semanas 37 y 42 del embarazo. Cuando esto no ocurre o cuando hay una necesidad mdica, pueden utilizarse diferentes mtodos para inducirlo. La induccin del trabajo de parto hace que el tero se contraiga. Tambin hace que el cuello del tero se ablandemadure), se abra (se dilate), y se afine (se borre). Generalmente el trabajo de parto no se induce antes de las 39 semanas excepto que haya un problema con el beb o con la madre.  Antes de inducir el trabajo de parto, el mdico considerar cierto nmero de factores incluyendo los siguientes:   El estado del beb.   Cuntas semanas tiene de embarazo.   La madurez de los pulmones del beb.   El estado del cuello del tero.   La posicin del beb.  CULES SON LOS MOTIVOS PARA INDUCIR UN PARTO?  El trabajo de parto puede inducirse por las siguientes razones:   La salud del beb o de la madre estn en riesgo.   El embarazo se ha pasado de trmino en 1 semana o ms.   Ha roto la bolsa de aguas pero no se ha iniciado el trabajo de parto por s mismo.   La madre tiene algn trastorno de salud o una enfermedad grave, como hipertensin arterial, una infeccin, desprendimiento abrupto de la placenta o diabetes.   Hay escaso lquido amnitico alrededor del beb.   El beb presenta sufrimiento.  La conveniencia o el deseo de que el beb nazca en una cierta fecha no es un motivo para inducir el parto.  CULES SON LOS MTODOS UTILIZADOS PARA INDUCIR EL TRABAJO DE PARTO?  Algunos mtodos de induccin del trabajo de parto son:   Administracin del medicamentos prostaglandina. Este medicamento hace que el cuello uterino se dilate y madure. Este medicamento tambin iniciar las contracciones. Puede tomarse por  boca o insertarse en la vagina en forma de supositorio.   Insercin en la vagina de un tubo delgado (catter) con un baln en el extremo para dilatar el cuello del tero. Una vez insertado, el baln se infla con agua, lo que provoca la apertura del cuello del tero.   Ruptura de las membranas. El mdico separa el saco amnitico del cuello uterino, haciendo que el cuello uterino se distienda y cause la liberacin de la hormona llamada progesterona. Esto hace que el tero se contraiga. Este procedimiento se realiza durante una visita al consultorio mdico. Le indicarn que vuelva a su casa y espere que se inicien las contracciones. Luego tendr que volver para la induccin.   Ruptura de la bolsa de aguas. El mdico romper el saco amnitico con un pequeo instrumento. Una vez que el saco amnitico se rompe, las contracciones deben comenzar. Pueden pasar algunas horas hasta que haga efecto.   Medicamentos que desencadenen o intensifiquen las contracciones. Se lo administrarn a travs de un catter por va intravenosa (IV) que se inserta en una de las venas del brazo.  Todos los mtodos de induccin, excepto la ruptura de membranas, se realizan en el hospital. La induccin se realizar en el hospital, de modo que usted y el beb puedan ser controlados cuidadosamente.  CUNTO TIEMPO LLEVA INDUCIR EL TRABAJO DE PARTO?  Algunas inducciones pueden demorar entre 2 y 3 das. Generalmente lleva menos   tiempo, dependiendo del estado del cuello del tero. Puede tomar ms tiempo si la induccin se realiza en etapas tempranas del embarazo o es su primer embarazo. Si han pasado 2 o 3 das y no se inicia el trabajo de parto, podrn enviarla a su casa o realizar una cesrea.  CULES SON LOS RIESGOS ASOCIADOS CON LA INDUCCiN DEL TRABAJO DE PARTO?  Algunos de los riesgos de la induccin son:   Cambios en la frecuencia cardaca fetal, por ejemplo los latidos son demasiado rpidos, o lentos, o errticos.   Riesgo de distrs  fetal.   Posibilidad de infeccin en la madre o el beb.   Aumento de la posibilidad de que sea necesaria una cesrea.   Ruptura (abrupcin) de la placenta del tero (raro).   Ruptura uterina (muy raro).  Cuando es necesario realizar la induccin por razones mdicas, los beneficios deben superar a los riesgos.  CULES SON ALGUNAS RAZONES PARA NO INDUCIR EL TRABAJO DE PARTO?  La induccin no debe realizarse si:   Se demuestra que el beb no tolera el trabajo de parto.   Fue sometida anteriormente a cirugas en el tero, como una miomectoma o le han extirpado fibromas.   La placenta est en una posicin muy baja en el tero y obstruye la abertura del cuello (placenta previa).   El beb no est ubicado con la cabeza hacia bajo.   El cordn umbilical cae hacia el canal de parto, adelante del beb. Esto puede cortar el suministro de sangre y oxgeno al beb.   Fue sometida a una cesrea anteriormente.   Hay circunstancias poco habituales, como que el beb es extremadamente prematuro.  Esta informacin no tiene como fin reemplazar el consejo del mdico. Asegrese de hacerle al mdico cualquier pregunta que tenga.  Document Released: 02/13/2008 Document Revised: 02/28/2016 Document Reviewed: 06/05/2013  Elsevier Interactive Patient Education  2017 Elsevier Inc.

## 2018-10-03 ENCOUNTER — Telehealth (HOSPITAL_COMMUNITY): Payer: Self-pay | Admitting: *Deleted

## 2018-10-03 ENCOUNTER — Encounter (HOSPITAL_COMMUNITY): Payer: Self-pay | Admitting: *Deleted

## 2018-10-03 NOTE — Telephone Encounter (Signed)
Preadmission screen Interpreter number [phone]

## 2018-10-08 ENCOUNTER — Ambulatory Visit (INDEPENDENT_AMBULATORY_CARE_PROVIDER_SITE_OTHER): Payer: Self-pay | Admitting: Student

## 2018-10-08 ENCOUNTER — Ambulatory Visit (INDEPENDENT_AMBULATORY_CARE_PROVIDER_SITE_OTHER): Payer: Self-pay | Admitting: Clinical

## 2018-10-08 DIAGNOSIS — Z3A40 40 weeks gestation of pregnancy: Secondary | ICD-10-CM

## 2018-10-08 DIAGNOSIS — Z3403 Encounter for supervision of normal first pregnancy, third trimester: Secondary | ICD-10-CM

## 2018-10-08 DIAGNOSIS — Z658 Other specified problems related to psychosocial circumstances: Secondary | ICD-10-CM

## 2018-10-08 DIAGNOSIS — Z3400 Encounter for supervision of normal first pregnancy, unspecified trimester: Secondary | ICD-10-CM

## 2018-10-08 NOTE — Progress Notes (Signed)
   PRENATAL VISIT NOTE  Subjective:  Katelyn [name] is a 32 y.o. G1P0000 at 5256w4d being seen today for ongoing prenatal care.  She is currently monitored for the following issues for this low-risk pregnancy and has Supervision of low-risk first pregnancy; History of loop electrical excision procedure (LEEP); History of colposcopy with cervical biopsy; and Language barrier on their problem list.  Patient reports no complaints.  Contractions: Irritability. Vag. Bleeding: None.  Movement: Present. Denies leaking of fluid.   The following portions of the patient's history were reviewed and updated as appropriate: allergies, current medications, past family history, past medical history, past social history, past surgical history and problem list. Problem list updated.  Objective:   Vitals:   10/08/18 1339  BP: 126/76  Pulse: 67  Weight: 211 lb 14.4 oz (96.1 kg)    Fetal Status: Fetal Heart Rate (bpm): 135 Fundal Height: 37 cm Movement: Present     General:  Alert, oriented and cooperative. Patient is in no acute distress.  Skin: Skin is warm and dry. No rash noted.   Cardiovascular: Normal heart rate noted  Respiratory: Normal respiratory effort, no problems with respiration noted  Abdomen: Soft, gravid, appropriate for gestational age.  Pain/Pressure: Present     Pelvic: Cervical exam performed Dilation: Closed Effacement (%): Thick Station: -2  Extremities: Normal range of motion.  Edema: None  Mental Status: Normal mood and affect. Normal behavior. Normal judgment and thought content.   Assessment and Plan:  Pregnancy: G1P0000 at 2656w4d  1. Encounter for supervision of low-risk first pregnancy, antepartum -Not Fb candidate; discussed labor support and pain relief options.  -Patient wants to see Asher MuirJamie today to talk about her stresses and anxieties over the past year; Asher MuirJamie agrees to see her.   Term labor symptoms and general obstetric precautions including but not limited to  vaginal bleeding, contractions, leaking of fluid and fetal movement were reviewed in detail with the patient. Please refer to After Visit Summary for other counseling recommendations.  No follow-ups on file.  Future Appointments  Date Time Provider Department Center  10/10/2018  1:15 PM WOC-WOCA NST WOC-WOCA WOC  10/11/2018  7:30 AM WH-BSSCHED ROOM WH-BSSCHED None    Marylene LandKathryn Lorraine Kooistra, CNM

## 2018-10-08 NOTE — BH Specialist Note (Signed)
Integrated Behavioral Health Initial Visit  MRN: [id number] Name: Katelyn [name] [facility]  Number of [place] Clinician visits:: 1/6 Session Start time: 2:25  Session End time: 2:53 Total time: 20 minutes  Type of Service: [place] Interpretor:Yes.   Interpretor Name and Language: Spanish   Warm Hand Off Completed.       SUBJECTIVE: Katelyn [name] is a 32 y.o. female accompanied by n/a Patient was referred by Maye Hides, CNM for life stress. Patient reports the following symptoms/concerns: Pt states her primary concern today is having to adjust to many life changes in the past year (move from [place], meeting and marrying her [place] husband, cultural changes, and unplanned pregnancy while uninsured).  Duration of problem: Increase in past year; Severity of problem: moderate  OBJECTIVE: Mood: Normal and Affect: Appropriate Risk of harm to self or others: No plan to harm self or others  LIFE CONTEXT: Family and Social: Pt lives with her husband School/Work: Pt studied Education officer, museum in [place] Self-Care: - Life Changes: Move from [place] one year ago, met husband in January, married; unplanned current pregnancy  GOALS ADDRESSED: Patient will: 1. Reduce symptoms of: anxiety, depression and stress 2. Increase knowledge and/or ability of: healthy habits and stress reduction  3. Demonstrate ability to: Increase healthy adjustment to current life circumstances and Increase adequate support systems for patient/family  INTERVENTIONS: Interventions utilized: Psychoeducation and/or Health Education and Link to [employer]  Standardized Assessments completed: not given today  ASSESSMENT: Patient currently experiencing Psychosocial stress.   Patient may benefit from psychoeducation and brief therapeutic interventions regarding preventing escalation of  symptoms of depression and anxiety, and life  stress. Marland Kitchen  PLAN: 1. Follow up with behavioral health clinician on : At postpartum visit, or earlier, as needed 2. Behavioral recommendations:  -Talk to [place] today([facility]) about applying for emergency Medicaid -Consider contacting community resources discussed in office visit today([employer],  [place]; local Spanish-speaking counselors, as needed) -psychoeducation and brief therapeutic interventions regarding preventing escalation of symptoms of depression and anxiety  3. Referral(s): [facility] [place] [facility] and [employer] Resources:  Finances 4. "From scale of 1-10, how likely are you to follow plan?": 9  Garlan Fair, LCSW  Depression screen [facility] 2/9 10/01/2018 04/19/2018  Decreased Interest - 2  Down, Depressed, Hopeless 1 1  PHQ - 2 Score 1 3  Altered sleeping 0 2  Tired, decreased energy - 1  Change in appetite 1 0  Feeling bad or failure about yourself  1 1  Trouble concentrating 2 0  Moving slowly or fidgety/restless 0 0  Suicidal thoughts 0 0  PHQ-9 Score 5 7   GAD 7 : Generalized Anxiety Score 10/01/2018 04/19/2018  Nervous, Anxious, on Edge 1 0  Control/stop worrying - 0  Worry too much - different things 1 1  Trouble relaxing 0 0  Restless 0 0  Easily annoyed or irritable 1 1  Afraid - awful might happen 0 0  Total GAD 7 Score - 2

## 2018-10-08 NOTE — Patient Instructions (Signed)
Induccin del trabajo de parto  (Labor Induction)  Se denomina induccin del trabajo de parto cuando se inician acciones para hacer que una mujer embarazada comience el trabajo de parto. La mayora de las mujeres comienzan el trabajo de parto sin ayuda entre las semanas 37 y 42 del embarazo. Cuando esto no ocurre o cuando hay una necesidad mdica, pueden utilizarse diferentes mtodos para inducirlo. La induccin del trabajo de parto hace que el tero se contraiga. Tambin hace que el cuello del tero se ablandemadure), se abra (se dilate), y se afine (se borre). Generalmente el trabajo de parto no se induce antes de las 39 semanas excepto que haya un problema con el beb o con la madre.  Antes de inducir el trabajo de parto, el mdico considerar cierto nmero de factores incluyendo los siguientes:   El estado del beb.   Cuntas semanas tiene de embarazo.   La madurez de los pulmones del beb.   El estado del cuello del tero.   La posicin del beb.  CULES SON LOS MOTIVOS PARA INDUCIR UN PARTO?  El trabajo de parto puede inducirse por las siguientes razones:   La salud del beb o de la madre estn en riesgo.   El embarazo se ha pasado de trmino en 1 semana o ms.   Ha roto la bolsa de aguas pero no se ha iniciado el trabajo de parto por s mismo.   La madre tiene algn trastorno de salud o una enfermedad grave, como hipertensin arterial, una infeccin, desprendimiento abrupto de la placenta o diabetes.   Hay escaso lquido amnitico alrededor del beb.   El beb presenta sufrimiento.  La conveniencia o el deseo de que el beb nazca en una cierta fecha no es un motivo para inducir el parto.  CULES SON LOS MTODOS UTILIZADOS PARA INDUCIR EL TRABAJO DE PARTO?  Algunos mtodos de induccin del trabajo de parto son:   Administracin del medicamentos prostaglandina. Este medicamento hace que el cuello uterino se dilate y madure. Este medicamento tambin iniciar las contracciones. Puede tomarse por  boca o insertarse en la vagina en forma de supositorio.   Insercin en la vagina de un tubo delgado (catter) con un baln en el extremo para dilatar el cuello del tero. Una vez insertado, el baln se infla con agua, lo que provoca la apertura del cuello del tero.   Ruptura de las membranas. El mdico separa el saco amnitico del cuello uterino, haciendo que el cuello uterino se distienda y cause la liberacin de la hormona llamada progesterona. Esto hace que el tero se contraiga. Este procedimiento se realiza durante una visita al consultorio mdico. Le indicarn que vuelva a su casa y espere que se inicien las contracciones. Luego tendr que volver para la induccin.   Ruptura de la bolsa de aguas. El mdico romper el saco amnitico con un pequeo instrumento. Una vez que el saco amnitico se rompe, las contracciones deben comenzar. Pueden pasar algunas horas hasta que haga efecto.   Medicamentos que desencadenen o intensifiquen las contracciones. Se lo administrarn a travs de un catter por va intravenosa (IV) que se inserta en una de las venas del brazo.  Todos los mtodos de induccin, excepto la ruptura de membranas, se realizan en el hospital. La induccin se realizar en el hospital, de modo que usted y el beb puedan ser controlados cuidadosamente.  CUNTO TIEMPO LLEVA INDUCIR EL TRABAJO DE PARTO?  Algunas inducciones pueden demorar entre 2 y 3 das. Generalmente lleva menos   tiempo, dependiendo del estado del cuello del tero. Puede tomar ms tiempo si la induccin se realiza en etapas tempranas del embarazo o es su primer embarazo. Si han pasado 2 o 3 das y no se inicia el trabajo de parto, podrn enviarla a su casa o realizar una cesrea.  CULES SON LOS RIESGOS ASOCIADOS CON LA INDUCCiN DEL TRABAJO DE PARTO?  Algunos de los riesgos de la induccin son:   Cambios en la frecuencia cardaca fetal, por ejemplo los latidos son demasiado rpidos, o lentos, o errticos.   Riesgo de distrs  fetal.   Posibilidad de infeccin en la madre o el beb.   Aumento de la posibilidad de que sea necesaria una cesrea.   Ruptura (abrupcin) de la placenta del tero (raro).   Ruptura uterina (muy raro).  Cuando es necesario realizar la induccin por razones mdicas, los beneficios deben superar a los riesgos.  CULES SON ALGUNAS RAZONES PARA NO INDUCIR EL TRABAJO DE PARTO?  La induccin no debe realizarse si:   Se demuestra que el beb no tolera el trabajo de parto.   Fue sometida anteriormente a cirugas en el tero, como una miomectoma o le han extirpado fibromas.   La placenta est en una posicin muy baja en el tero y obstruye la abertura del cuello (placenta previa).   El beb no est ubicado con la cabeza hacia bajo.   El cordn umbilical cae hacia el canal de parto, adelante del beb. Esto puede cortar el suministro de sangre y oxgeno al beb.   Fue sometida a una cesrea anteriormente.   Hay circunstancias poco habituales, como que el beb es extremadamente prematuro.  Esta informacin no tiene como fin reemplazar el consejo del mdico. Asegrese de hacerle al mdico cualquier pregunta que tenga.  Document Released: 02/13/2008 Document Revised: 02/28/2016 Document Reviewed: 06/05/2013  Elsevier Interactive Patient Education  2017 Elsevier Inc.

## 2018-10-10 ENCOUNTER — Ambulatory Visit: Payer: Self-pay

## 2018-10-10 ENCOUNTER — Ambulatory Visit (INDEPENDENT_AMBULATORY_CARE_PROVIDER_SITE_OTHER): Payer: Medicaid Other | Admitting: *Deleted

## 2018-10-10 VITALS — BP 121/76 | HR 70 | Wt 212.2 lb

## 2018-10-10 DIAGNOSIS — O480 Post-term pregnancy: Secondary | ICD-10-CM

## 2018-10-10 NOTE — Progress Notes (Addendum)
Interpreter Katelyn RiggsMariel [name] present for encounter.  Pt informed that the ultrasound is considered a limited OB ultrasound and is not intended to be a complete ultrasound exam.  Patient also informed that the ultrasound is not being completed with the intent of assessing for fetal or placental anomalies or any pelvic abnormalities.  Explained that the purpose of today's ultrasound is to assess for presentation and amniotic fluid volume.  Patient acknowledges the purpose of the exam and the limitations of the study.    Pt is scheduled for IOL tomorrow @ 0730.

## 2018-10-11 ENCOUNTER — Inpatient Hospital Stay (HOSPITAL_COMMUNITY)
Admission: RE | Admit: 2018-10-11 | Discharge: 2018-10-14 | DRG: 788 | Disposition: A | Discharge status: Home / Self Care | Payer: Medicaid Other | Attending: Obstetrics and Gynecology | Admitting: Obstetrics and Gynecology

## 2018-10-11 ENCOUNTER — Inpatient Hospital Stay (HOSPITAL_COMMUNITY): Payer: Medicaid Other

## 2018-10-11 ENCOUNTER — Encounter (HOSPITAL_COMMUNITY): Payer: Self-pay

## 2018-10-11 ENCOUNTER — Inpatient Hospital Stay (HOSPITAL_COMMUNITY): Payer: Medicaid Other | Admitting: Anesthesiology

## 2018-10-11 ENCOUNTER — Encounter (HOSPITAL_COMMUNITY)
Admission: RE | Disposition: A | Discharge status: Home / Self Care | Payer: Self-pay | Source: Home / Self Care | Attending: Obstetrics and Gynecology

## 2018-10-11 VITALS — BP 120/62 | HR 61 | Temp 98.0°F | Resp 18 | Ht 63.0 in | Wt 212.9 lb

## 2018-10-11 DIAGNOSIS — Z3A41 41 weeks gestation of pregnancy: Secondary | ICD-10-CM

## 2018-10-11 DIAGNOSIS — O480 Post-term pregnancy: Secondary | ICD-10-CM | POA: Diagnosis present

## 2018-10-11 DIAGNOSIS — D649 Anemia, unspecified: Secondary | ICD-10-CM | POA: Diagnosis present

## 2018-10-11 DIAGNOSIS — Z3490 Encounter for supervision of normal pregnancy, unspecified, unspecified trimester: Secondary | ICD-10-CM

## 2018-10-11 DIAGNOSIS — O9902 Anemia complicating childbirth: Secondary | ICD-10-CM | POA: Diagnosis present

## 2018-10-11 DIAGNOSIS — Z3400 Encounter for supervision of normal first pregnancy, unspecified trimester: Secondary | ICD-10-CM

## 2018-10-11 DIAGNOSIS — Z98891 History of uterine scar from previous surgery: Secondary | ICD-10-CM

## 2018-10-11 LAB — TYPE AND SCREEN
ABO/RH(D): O POS
ANTIBODY SCREEN: NEGATIVE

## 2018-10-11 LAB — CBC
HCT: 38.3 % (ref 36.0–46.0)
Hemoglobin: 12.6 g/dL (ref 12.0–15.0)
MCH: 28.5 pg (ref 26.0–34.0)
MCHC: 32.9 g/dL (ref 30.0–36.0)
MCV: 86.7 fL (ref 80.0–100.0)
NRBC: 0 % (ref 0.0–0.2)
PLATELETS: 276 10*3/uL (ref 150–400)
RBC: 4.42 MIL/uL (ref 3.87–5.11)
RDW: 13.1 % (ref 11.5–15.5)
WBC: 13.2 10*3/uL — ABNORMAL HIGH (ref 4.0–10.5)

## 2018-10-11 SURGERY — Surgical Case
Anesthesia: Epidural

## 2018-10-11 MED ORDER — ENOXAPARIN SODIUM 40 MG/0.4ML ~~LOC~~ SOLN
40.0000 mg | SUBCUTANEOUS | Status: DC
  Administered 2018-10-12 – 2018-10-13 (×2): 40 mg via SUBCUTANEOUS
  Filled 2018-10-11 (×2): qty 0.4

## 2018-10-11 MED ORDER — KETOROLAC TROMETHAMINE 30 MG/ML IJ SOLN
INTRAMUSCULAR | Status: AC
  Filled 2018-10-11: qty 1

## 2018-10-11 MED ORDER — IBUPROFEN 600 MG PO TABS
600.0000 mg | ORAL_TABLET | Freq: Four times a day (QID) | ORAL | Status: DC
  Administered 2018-10-12 – 2018-10-14 (×10): 600 mg via ORAL
  Filled 2018-10-11 (×10): qty 1

## 2018-10-11 MED ORDER — MEPERIDINE HCL 25 MG/ML IJ SOLN: 6.2500 mg | INTRAMUSCULAR | Status: DC | PRN

## 2018-10-11 MED ORDER — PHENYLEPHRINE 40 MCG/ML (10ML) SYRINGE FOR IV PUSH (FOR BLOOD PRESSURE SUPPORT)
80.0000 ug | PREFILLED_SYRINGE | INTRAVENOUS | Status: DC | PRN
  Administered 2018-10-11: 80 ug via INTRAVENOUS

## 2018-10-11 MED ORDER — DEXAMETHASONE SODIUM PHOSPHATE 10 MG/ML IJ SOLN
INTRAMUSCULAR | Status: DC | PRN
  Administered 2018-10-11: 10 mg via INTRAVENOUS

## 2018-10-11 MED ORDER — ONDANSETRON HCL 4 MG/2ML IJ SOLN
INTRAMUSCULAR | Status: AC
  Filled 2018-10-11: qty 2

## 2018-10-11 MED ORDER — EPHEDRINE 5 MG/ML INJ
10.0000 mg | INTRAVENOUS | Status: DC | PRN
  Administered 2018-10-11: 10 mg via INTRAVENOUS

## 2018-10-11 MED ORDER — NALBUPHINE HCL 10 MG/ML IJ SOLN: 5.0000 mg | INTRAMUSCULAR | Status: DC | PRN

## 2018-10-11 MED ORDER — PIPERACILLIN-TAZOBACTAM 3.375 G IVPB
3.3750 g | Freq: Three times a day (TID) | INTRAVENOUS | Status: AC
  Administered 2018-10-12 – 2018-10-13 (×4): 3.375 g via INTRAVENOUS
  Filled 2018-10-11 (×4): qty 50

## 2018-10-11 MED ORDER — EPHEDRINE 5 MG/ML INJ: 10.0000 mg | INTRAVENOUS | Status: DC | PRN

## 2018-10-11 MED ORDER — OXYTOCIN 40 UNITS IN LACTATED RINGERS INFUSION - SIMPLE MED: 2.5000 [IU]/h | INTRAVENOUS | Status: AC

## 2018-10-11 MED ORDER — LACTATED RINGERS IV SOLN: 500.0000 mL | INTRAVENOUS | Status: DC | PRN

## 2018-10-11 MED ORDER — NALOXONE HCL 4 MG/10ML IJ SOLN
1.0000 ug/kg/h | INTRAVENOUS | Status: DC | PRN
  Filled 2018-10-11: qty 5

## 2018-10-11 MED ORDER — ACETAMINOPHEN 325 MG PO TABS
650.0000 mg | ORAL_TABLET | ORAL | Status: DC | PRN
  Administered 2018-10-12 – 2018-10-13 (×2): 650 mg via ORAL
  Filled 2018-10-11 (×3): qty 2

## 2018-10-11 MED ORDER — PRENATAL 27-0.8 MG PO TABS: 1.0000 | ORAL_TABLET | Freq: Every day | ORAL | Status: DC

## 2018-10-11 MED ORDER — LACTATED RINGERS IV SOLN: 500.0000 mL | Freq: Once | INTRAVENOUS | Status: DC

## 2018-10-11 MED ORDER — SODIUM CHLORIDE 0.9 % IV SOLN
INTRAVENOUS | Status: DC | PRN
  Administered 2018-10-11: 21:00:00 via INTRAVENOUS

## 2018-10-11 MED ORDER — OXYCODONE HCL 5 MG PO TABS: 5.0000 mg | ORAL_TABLET | Freq: Once | ORAL | Status: DC | PRN

## 2018-10-11 MED ORDER — ONDANSETRON HCL 4 MG/2ML IJ SOLN: 4.0000 mg | Freq: Four times a day (QID) | INTRAMUSCULAR | Status: DC | PRN

## 2018-10-11 MED ORDER — SOD CITRATE-CITRIC ACID 500-334 MG/5ML PO SOLN
30.0000 mL | ORAL | Status: DC | PRN
  Filled 2018-10-11: qty 15

## 2018-10-11 MED ORDER — SCOPOLAMINE 1 MG/3DAYS TD PT72
MEDICATED_PATCH | TRANSDERMAL | Status: AC
  Filled 2018-10-11: qty 1

## 2018-10-11 MED ORDER — PHENYLEPHRINE 40 MCG/ML (10ML) SYRINGE FOR IV PUSH (FOR BLOOD PRESSURE SUPPORT)
PREFILLED_SYRINGE | INTRAVENOUS | Status: AC
  Administered 2018-10-11: 80 ug via INTRAVENOUS
  Filled 2018-10-11: qty 10

## 2018-10-11 MED ORDER — OXYTOCIN 10 UNIT/ML IJ SOLN
INTRAMUSCULAR | Status: AC
  Filled 2018-10-11: qty 4

## 2018-10-11 MED ORDER — EPHEDRINE 5 MG/ML INJ
INTRAVENOUS | Status: AC
  Administered 2018-10-11: 10 mg via INTRAVENOUS
  Filled 2018-10-11: qty 4

## 2018-10-11 MED ORDER — PROMETHAZINE HCL 25 MG/ML IJ SOLN: 6.2500 mg | INTRAMUSCULAR | Status: DC | PRN

## 2018-10-11 MED ORDER — OXYTOCIN 40 UNITS IN LACTATED RINGERS INFUSION - SIMPLE MED
2.5000 [IU]/h | INTRAVENOUS | Status: DC
  Administered 2018-10-11: 1 m[IU]/min via INTRAVENOUS
  Filled 2018-10-11: qty 1000

## 2018-10-11 MED ORDER — TETANUS-DIPHTH-ACELL PERTUSSIS 5-2.5-18.5 LF-MCG/0.5 IM SUSP: 0.5000 mL | Freq: Once | INTRAMUSCULAR | Status: DC

## 2018-10-11 MED ORDER — KETOROLAC TROMETHAMINE 30 MG/ML IJ SOLN: 30.0000 mg | Freq: Four times a day (QID) | INTRAMUSCULAR | Status: AC | PRN

## 2018-10-11 MED ORDER — DIPHENHYDRAMINE HCL 25 MG PO CAPS
25.0000 mg | ORAL_CAPSULE | ORAL | Status: DC | PRN
  Filled 2018-10-11: qty 1

## 2018-10-11 MED ORDER — LACTATED RINGERS IV SOLN
INTRAVENOUS | Status: DC
  Administered 2018-10-12: 02:00:00 via INTRAVENOUS

## 2018-10-11 MED ORDER — FENTANYL 2.5 MCG/ML BUPIVACAINE 1/10 % EPIDURAL INFUSION (WH - ANES)
14.0000 mL/h | INTRAMUSCULAR | Status: DC | PRN
  Administered 2018-10-11: 14 mL/h via EPIDURAL

## 2018-10-11 MED ORDER — MORPHINE SULFATE (PF) 0.5 MG/ML IJ SOLN
INTRAMUSCULAR | Status: DC | PRN
  Administered 2018-10-11: 3 mg via EPIDURAL

## 2018-10-11 MED ORDER — LIDOCAINE HCL (PF) 1 % IJ SOLN: 30.0000 mL | INTRAMUSCULAR | Status: DC | PRN

## 2018-10-11 MED ORDER — FENTANYL CITRATE (PF) 100 MCG/2ML IJ SOLN: 25.0000 ug | INTRAMUSCULAR | Status: DC | PRN

## 2018-10-11 MED ORDER — PRENATAL MULTIVITAMIN CH
1.0000 | ORAL_TABLET | Freq: Every day | ORAL | Status: DC
  Administered 2018-10-12 – 2018-10-14 (×3): 1 via ORAL
  Filled 2018-10-11 (×3): qty 1

## 2018-10-11 MED ORDER — CEFAZOLIN SODIUM-DEXTROSE 2-4 GM/100ML-% IV SOLN
INTRAVENOUS | Status: AC
  Filled 2018-10-11: qty 100

## 2018-10-11 MED ORDER — LIDOCAINE HCL (PF) 1 % IJ SOLN
INTRAMUSCULAR | Status: DC | PRN
  Administered 2018-10-11: 5 mL via EPIDURAL

## 2018-10-11 MED ORDER — SIMETHICONE 80 MG PO CHEW
80.0000 mg | CHEWABLE_TABLET | Freq: Three times a day (TID) | ORAL | Status: DC
  Administered 2018-10-12 – 2018-10-14 (×6): 80 mg via ORAL
  Filled 2018-10-11 (×6): qty 1

## 2018-10-11 MED ORDER — SODIUM CHLORIDE 0.9 % IR SOLN
Status: DC | PRN
  Administered 2018-10-11: 1

## 2018-10-11 MED ORDER — OXYCODONE-ACETAMINOPHEN 5-325 MG PO TABS: 2.0000 | ORAL_TABLET | ORAL | Status: DC | PRN

## 2018-10-11 MED ORDER — DIPHENHYDRAMINE HCL 25 MG PO CAPS
25.0000 mg | ORAL_CAPSULE | Freq: Four times a day (QID) | ORAL | Status: DC | PRN
  Administered 2018-10-12: 25 mg via ORAL

## 2018-10-11 MED ORDER — SODIUM CHLORIDE 0.9 % IR SOLN
Status: DC | PRN
  Administered 2018-10-11: 1000 mL

## 2018-10-11 MED ORDER — DIPHENHYDRAMINE HCL 50 MG/ML IJ SOLN: 12.5000 mg | INTRAMUSCULAR | Status: DC | PRN

## 2018-10-11 MED ORDER — KETOROLAC TROMETHAMINE 30 MG/ML IJ SOLN
30.0000 mg | Freq: Four times a day (QID) | INTRAMUSCULAR | Status: AC | PRN
  Administered 2018-10-11: 30 mg via INTRAVENOUS

## 2018-10-11 MED ORDER — SCOPOLAMINE 1 MG/3DAYS TD PT72
1.0000 | MEDICATED_PATCH | Freq: Once | TRANSDERMAL | Status: DC
  Filled 2018-10-11: qty 1

## 2018-10-11 MED ORDER — SENNOSIDES-DOCUSATE SODIUM 8.6-50 MG PO TABS
2.0000 | ORAL_TABLET | ORAL | Status: DC
  Administered 2018-10-12 – 2018-10-14 (×2): 2 via ORAL
  Filled 2018-10-11 (×2): qty 2

## 2018-10-11 MED ORDER — PHENYLEPHRINE 40 MCG/ML (10ML) SYRINGE FOR IV PUSH (FOR BLOOD PRESSURE SUPPORT): 80.0000 ug | PREFILLED_SYRINGE | INTRAVENOUS | Status: DC | PRN

## 2018-10-11 MED ORDER — SIMETHICONE 80 MG PO CHEW
80.0000 mg | CHEWABLE_TABLET | ORAL | Status: DC
  Administered 2018-10-12 – 2018-10-14 (×2): 80 mg via ORAL
  Filled 2018-10-11 (×2): qty 1

## 2018-10-11 MED ORDER — OXYCODONE HCL 5 MG/5ML PO SOLN: 5.0000 mg | Freq: Once | ORAL | Status: DC | PRN

## 2018-10-11 MED ORDER — NALOXONE HCL 0.4 MG/ML IJ SOLN: 0.4000 mg | INTRAMUSCULAR | Status: DC | PRN

## 2018-10-11 MED ORDER — OXYTOCIN BOLUS FROM INFUSION: 500.0000 mL | Freq: Once | INTRAVENOUS | Status: DC

## 2018-10-11 MED ORDER — MENTHOL 3 MG MT LOZG: 1.0000 | LOZENGE | OROMUCOSAL | Status: DC | PRN

## 2018-10-11 MED ORDER — SIMETHICONE 80 MG PO CHEW: 80.0000 mg | CHEWABLE_TABLET | ORAL | Status: DC | PRN

## 2018-10-11 MED ORDER — ACETAMINOPHEN 325 MG PO TABS: 650.0000 mg | ORAL_TABLET | ORAL | Status: DC | PRN

## 2018-10-11 MED ORDER — TERBUTALINE SULFATE 1 MG/ML IJ SOLN
0.2500 mg | Freq: Once | INTRAMUSCULAR | Status: AC | PRN
  Administered 2018-10-11: 0.25 mg via SUBCUTANEOUS
  Filled 2018-10-11: qty 1

## 2018-10-11 MED ORDER — WITCH HAZEL-GLYCERIN EX PADS: 1.0000 "application " | MEDICATED_PAD | CUTANEOUS | Status: DC | PRN

## 2018-10-11 MED ORDER — NALBUPHINE HCL 10 MG/ML IJ SOLN: 5.0000 mg | Freq: Once | INTRAMUSCULAR | Status: DC | PRN

## 2018-10-11 MED ORDER — METHYLERGONOVINE MALEATE 0.2 MG PO TABS: 0.2000 mg | ORAL_TABLET | ORAL | Status: DC | PRN

## 2018-10-11 MED ORDER — DEXAMETHASONE SODIUM PHOSPHATE 10 MG/ML IJ SOLN
INTRAMUSCULAR | Status: AC
  Filled 2018-10-11: qty 1

## 2018-10-11 MED ORDER — METHYLERGONOVINE MALEATE 0.2 MG/ML IJ SOLN: 0.2000 mg | INTRAMUSCULAR | Status: DC | PRN

## 2018-10-11 MED ORDER — SODIUM CHLORIDE 0.9% FLUSH: 3.0000 mL | INTRAVENOUS | Status: DC | PRN

## 2018-10-11 MED ORDER — SCOPOLAMINE 1 MG/3DAYS TD PT72
MEDICATED_PATCH | TRANSDERMAL | Status: DC | PRN
  Administered 2018-10-11: 1 via TRANSDERMAL

## 2018-10-11 MED ORDER — ONDANSETRON HCL 4 MG/2ML IJ SOLN
INTRAMUSCULAR | Status: DC | PRN
  Administered 2018-10-11: 4 mg via INTRAVENOUS

## 2018-10-11 MED ORDER — FENTANYL 2.5 MCG/ML BUPIVACAINE 1/10 % EPIDURAL INFUSION (WH - ANES)
INTRAMUSCULAR | Status: AC
  Filled 2018-10-11: qty 100

## 2018-10-11 MED ORDER — OXYTOCIN 10 UNIT/ML IJ SOLN
INTRAVENOUS | Status: DC | PRN
  Administered 2018-10-11: 40 [IU] via INTRAVENOUS

## 2018-10-11 MED ORDER — OXYCODONE-ACETAMINOPHEN 5-325 MG PO TABS
1.0000 | ORAL_TABLET | ORAL | Status: DC | PRN
  Administered 2018-10-14: 1 via ORAL
  Filled 2018-10-11: qty 1

## 2018-10-11 MED ORDER — ZOLPIDEM TARTRATE 5 MG PO TABS: 5.0000 mg | ORAL_TABLET | Freq: Every evening | ORAL | Status: DC | PRN

## 2018-10-11 MED ORDER — TERBUTALINE SULFATE 1 MG/ML IJ SOLN
INTRAMUSCULAR | Status: AC
  Administered 2018-10-11: 1 mg
  Filled 2018-10-11: qty 1

## 2018-10-11 MED ORDER — MORPHINE SULFATE (PF) 0.5 MG/ML IJ SOLN
INTRAMUSCULAR | Status: AC
  Filled 2018-10-11: qty 10

## 2018-10-11 MED ORDER — DIBUCAINE 1 % RE OINT: 1.0000 "application " | TOPICAL_OINTMENT | RECTAL | Status: DC | PRN

## 2018-10-11 MED ORDER — LIDOCAINE-EPINEPHRINE (PF) 2 %-1:200000 IJ SOLN
INTRAMUSCULAR | Status: DC | PRN
  Administered 2018-10-11: 10 mL via EPIDURAL

## 2018-10-11 MED ORDER — OXYCODONE-ACETAMINOPHEN 5-325 MG PO TABS: 1.0000 | ORAL_TABLET | ORAL | Status: DC | PRN

## 2018-10-11 MED ORDER — LACTATED RINGERS IV SOLN
INTRAVENOUS | Status: DC
  Administered 2018-10-11 (×2): via INTRAVENOUS

## 2018-10-11 MED ORDER — OXYTOCIN 40 UNITS IN LACTATED RINGERS INFUSION - SIMPLE MED: 1.0000 m[IU]/min | INTRAVENOUS | Status: DC

## 2018-10-11 MED ORDER — MISOPROSTOL 25 MCG QUARTER TABLET
25.0000 ug | ORAL_TABLET | ORAL | Status: DC | PRN
  Administered 2018-10-11 (×2): 25 ug via VAGINAL
  Filled 2018-10-11 (×2): qty 1

## 2018-10-11 MED ORDER — COCONUT OIL OIL: 1.0000 "application " | TOPICAL_OIL | Status: DC | PRN

## 2018-10-11 MED ORDER — ONDANSETRON HCL 4 MG/2ML IJ SOLN: 4.0000 mg | Freq: Three times a day (TID) | INTRAMUSCULAR | Status: DC | PRN

## 2018-10-11 SURGICAL SUPPLY — 37 items
CHLORAPREP W/TINT 26ML (MISCELLANEOUS) ×3 IMPLANT
CLAMP CORD UMBIL (MISCELLANEOUS) IMPLANT
CLOTH BEACON ORANGE TIMEOUT ST (SAFETY) ×3 IMPLANT
DERMABOND ADVANCED (GAUZE/BANDAGES/DRESSINGS) ×2
DERMABOND ADVANCED .7 DNX12 (GAUZE/BANDAGES/DRESSINGS) ×1 IMPLANT
DRSG OPSITE POSTOP 4X10 (GAUZE/BANDAGES/DRESSINGS) ×3 IMPLANT
ELECT REM PT RETURN 9FT ADLT (ELECTROSURGICAL) ×3
ELECTRODE REM PT RTRN 9FT ADLT (ELECTROSURGICAL) ×1 IMPLANT
EXTRACTOR VACUUM BELL STYLE (SUCTIONS) IMPLANT
GLOVE BIOGEL PI IND STRL 7.0 (GLOVE) ×1 IMPLANT
GLOVE BIOGEL PI IND STRL 8 (GLOVE) ×1 IMPLANT
GLOVE BIOGEL PI INDICATOR 7.0 (GLOVE) ×2
GLOVE BIOGEL PI INDICATOR 8 (GLOVE) ×2
GLOVE ECLIPSE 8.0 STRL XLNG CF (GLOVE) ×3 IMPLANT
GOWN STRL REUS W/TWL LRG LVL3 (GOWN DISPOSABLE) ×6 IMPLANT
KIT ABG SYR 3ML LUER SLIP (SYRINGE) ×3 IMPLANT
NEEDLE HYPO 18GX1.5 BLUNT FILL (NEEDLE) ×3 IMPLANT
NEEDLE HYPO 22GX1.5 SAFETY (NEEDLE) ×3 IMPLANT
NEEDLE HYPO 25X5/8 SAFETYGLIDE (NEEDLE) ×3 IMPLANT
NS IRRIG 1000ML POUR BTL (IV SOLUTION) ×3 IMPLANT
PACK C SECTION WH (CUSTOM PROCEDURE TRAY) ×3 IMPLANT
PAD OB MATERNITY 4.3X12.25 (PERSONAL CARE ITEMS) ×3 IMPLANT
PENCIL SMOKE EVAC W/HOLSTER (ELECTROSURGICAL) ×3 IMPLANT
RTRCTR C-SECT PINK 25CM LRG (MISCELLANEOUS) IMPLANT
SPONGE LAP 18X18 RF (DISPOSABLE) ×9 IMPLANT
SUT CHROMIC 0 CT 1 (SUTURE) ×3 IMPLANT
SUT MNCRL 0 VIOLET CTX 36 (SUTURE) ×2 IMPLANT
SUT MONOCRYL 0 CTX 36 (SUTURE) ×4
SUT PLAIN 2 0 (SUTURE)
SUT PLAIN 2 0 XLH (SUTURE) IMPLANT
SUT PLAIN ABS 2-0 CT1 27XMFL (SUTURE) IMPLANT
SUT VIC AB 0 CTX 36 (SUTURE) ×2
SUT VIC AB 0 CTX36XBRD ANBCTRL (SUTURE) ×1 IMPLANT
SUT VIC AB 4-0 KS 27 (SUTURE) IMPLANT
SYR 20CC LL (SYRINGE) ×6 IMPLANT
TOWEL OR 17X24 6PK STRL BLUE (TOWEL DISPOSABLE) ×3 IMPLANT
TRAY FOLEY W/BAG SLVR 14FR LF (SET/KITS/TRAYS/PACK) IMPLANT

## 2018-10-11 NOTE — Progress Notes (Signed)
Katelyn [name] is a 32 y.o. G1P0000 at 4135w0d admitted for induction of labor due to Post dates. Due date 10/04/18.  Subjective: Patient in bed, complaining of increased pain and requesting epidural for pain relief.   Objective: BP 120/75   Pulse 68   Temp 97.6 F (36.4 C) (Axillary)   Resp 18   Ht 5\' 3"  (1.6 m)   Wt 96.6 kg   LMP 12/28/2017 (Exact Date)   BMI 37.71 kg/m  No intake/output data recorded.  FHT:  FHR: 155 bpm, variability: moderate,  accelerations:  Present,  decelerations:  Absent UC:   regular, every 1-3 minutes, cervix remains unchanged SVE:   Dilation: 5 Effacement (%): 80 Station: -1 Exam by:: [name], SCNM  Labs: Lab Results  Component Value Date   WBC 13.2 (H) 10/11/2018   HGB 12.6 10/11/2018   HCT 38.3 10/11/2018   MCV 86.7 10/11/2018   PLT 276 10/11/2018    Assessment / Plan: IOL, cervix has remained unchanged with position change, will begin Pitocin  Labor: Latent labor, will begin Pitocin Preeclampsia:  no signs or symptoms of toxicity Fetal Wellbeing:  Category I Pain Control:  Epidural I/D:  n/a Anticipated MOD:  NSVD  [name], SNM 10/11/2018, 5:23 PM

## 2018-10-11 NOTE — H&P (Addendum)
Katelyn [name] is a 32 y.o. female G1P0 @ 41.0wks presenting for IOL for post-dates.  OB History    Gravida  1   Para  0   Term  0   Preterm  0   AB  0   Living  0     SAB  0   TAB  0   Ectopic  0   Multiple  0   Live Births  0          Past Medical History:  Diagnosis Date  . Medical history non-contributory    Past Surgical History:  Procedure Laterality Date  . NO PAST SURGERIES     Family History: family history includes Cancer in her maternal grandfather; Diabetes in her maternal grandmother; Hypertension in her father. Social History:  reports that she has never smoked. She has never used smokeless tobacco. She reports that she does not drink alcohol or use drugs.     Maternal Diabetes: No Genetic Screening: Normal Maternal Ultrasounds/Referrals: Normal Fetal Ultrasounds or other Referrals:  None Maternal Substance Abuse:  No Significant Maternal Medications:  None Significant Maternal Lab Results:  Lab values include: Group B Strep negative Other Comments:  None  ROS Maternal Medical History:  Contractions: Frequency: irregular.   Perceived severity is mild.    Fetal activity: Perceived fetal activity is normal.   Last perceived fetal movement was within the past hour.    Prenatal complications: no prenatal complications Prenatal Complications - Diabetes: none.    Dilation: 1 Effacement (%): Thick Station: -1 Exam by:: Erven CollaMelissa Wilkins, RN Blood pressure 128/76, pulse 77, temperature 98.7 F (37.1 C), temperature source Oral, resp. rate 18, height 5\' 3"  (1.6 m), weight 96.6 kg, last menstrual period 12/28/2017. Maternal Exam:  Uterine Assessment: Contraction strength is mild.  Contraction frequency is irregular.   Abdomen: Patient reports no abdominal tenderness. Fetal presentation: vertex     Fetal Exam Fetal Monitor Review: Mode: ultrasound.   Variability: moderate (6-25 bpm).   Pattern: accelerations present and no  decelerations.    Fetal State Assessment: Category I - tracings are normal.     Physical Exam  Nursing note and vitals reviewed. Constitutional: She is oriented to person, place, and time. She appears well-developed and well-nourished.  HENT:  Head: Normocephalic.  Eyes: Pupils are equal, round, and reactive to light.  Neck: Normal range of motion.  Cardiovascular: Normal rate, regular rhythm and normal heart sounds.  Respiratory: Effort normal and breath sounds normal.  GI: Soft. Bowel sounds are normal.  Musculoskeletal: Normal range of motion.  Neurological: She is alert and oriented to person, place, and time. She has normal reflexes.  Skin: Skin is warm and dry.  Psychiatric: She has a normal mood and affect. Her behavior is normal. Judgment and thought content normal.    Prenatal labs: ABO, Rh: --/--/O POS (11/22 [id number]) Antibody: NEG (11/22 0824) Rubella: 20.70 (05/31 1438) RPR: Non Reactive (09/03 0823)  HBsAg: Negative (05/31 1438)  HIV: Non Reactive (09/03 0823)  GBS:   Neg 09/10/18  Assessment/Plan: IOL for post-dates. Cytotec given, will monitor and progress with Pitocin if needed. Planning for epidural.  Bernerd LimboJamilla R Walker, SNM 10/11/2018, 10:17 AM  CNM attestation:  I have seen and examined this patient; I agree with above documentation in the midwife student's note.   Katelyn [name] is a 32 y.o. G1P0000 @ 41.0wks here for IOL due to postdates  PE: BP 120/75   Pulse 68   Temp 97.6  F (36.4 C) (Axillary)   Resp 18   Ht 5\' 3"  (1.6 m)   Wt 96.6 kg   LMP 12/28/2017 (Exact Date)   BMI 37.71 kg/m  Gen: calm comfortable, NAD Resp: normal effort, no distress Abd: gravid  ROS, labs, PMH reviewed  Plan: Admit to [facility] cx ripening with cytotec, cervical foley, then AROM/Pit prn Anticipate SVD  Arabella Merles CNM 10/11/2018, 1:22 PM

## 2018-10-11 NOTE — Transfer of Care (Signed)
Immediate Anesthesia Transfer of Care Note  Patient: Katelyn [name]  Procedure(s) Performed: CESAREAN SECTION (N/A )  Patient Location: PACU  Anesthesia Type:Epidural  Level of Consciousness: awake, alert  and patient cooperative  Airway & Oxygen Therapy: Patient Spontanous Breathing  Post-op Assessment: Report given to RN and Post -op Vital signs reviewed and stable  Post vital signs: Reviewed and stable  Last Vitals:  Vitals Value Taken Time  BP    Temp    Pulse 87 10/11/2018  9:47 PM  Resp 19 10/11/2018  9:47 PM  SpO2 100 % 10/11/2018  9:47 PM  Vitals shown include unvalidated device data.  Last Pain:  Vitals:   10/11/18 1921  TempSrc: Oral  PainSc:          Complications: No apparent anesthesia complications

## 2018-10-11 NOTE — Anesthesia Postprocedure Evaluation (Signed)
Anesthesia Post Note  Patient: Katelyn [name]  Procedure(s) Performed: CESAREAN SECTION (N/A )     Patient location during evaluation: PACU Anesthesia Type: Epidural Level of consciousness: awake and alert Pain management: pain level controlled Vital Signs Assessment: post-procedure vital signs reviewed and stable Respiratory status: spontaneous breathing, respiratory function stable and nonlabored ventilation Cardiovascular status: blood pressure returned to baseline Postop Assessment: epidural receding and no apparent nausea or vomiting Anesthetic complications: no    Last Vitals:  Vitals:   10/11/18 2230 10/11/18 2255  BP: (!) 105/54 104/62  Pulse: (!) 106 73  Resp: (!) 25 18  Temp:  36.8 C  SpO2: 100% 99%    Last Pain:  Vitals:   10/11/18 2258  TempSrc:   PainSc: 0-No pain   Pain Goal:                 Beryle Lathehomas E 

## 2018-10-11 NOTE — Anesthesia Procedure Notes (Signed)
Epidural Patient location during procedure: OB Start time: 10/11/2018 5:12 PM End time: 10/11/2018 5:37 PM  Staffing Anesthesiologist: Trevor IhaHouser, Stephen A, MD Performed: anesthesiologist   Preanesthetic Checklist Completed: patient identified, site marked, surgical consent, pre-op evaluation, timeout performed, IV checked, risks and benefits discussed and monitors and equipment checked  Epidural Patient position: sitting Prep: site prepped and draped and DuraPrep Patient monitoring: continuous pulse ox and blood pressure Approach: midline Location: L3-L4 Injection technique: LOR air  Needle:  Needle type: Tuohy  Needle gauge: 17 G Needle length: 9 cm and 9 Needle insertion depth: 8 cm Catheter type: closed end flexible Catheter size: 19 Gauge Catheter at skin depth: 13 cm Test dose: negative  Assessment Events: blood not aspirated, injection not painful, no injection resistance, negative IV test and no paresthesia  Additional Notes 1 attempt. Patient tolerated procedure well. Translator present

## 2018-10-11 NOTE — Op Note (Signed)
Preoperative diagnosis:  1.  Intrauterine pregnancy at 6839w0d  weeks gestation                                         2.  Fetal bradycardia, unresponsive to coonservative measures                                            Postoperative diagnosis:  Same as above   Procedure:  Primary cesarean section, emergent  Surgeon:  Lazaro ArmsLuther H  MD  Assistant:    Anesthesia: epidural  Findings:  I was called to room at 2040, based on time recorded in my attending phone.  The FHR was down in the 60s for several minutes when I arrived and there was no response to conservative measures.  The fetal vertex was molded and the station was not appropriate for operative vaginal delivery.  Therefore, I called an emergecny Caesarean section.    Over a low transverse incision was delivered a viable female with Apgars of 8 and 9 at 2052 weighing pending lbs.  oz. Uterus, tubes and ovaries were all normal.  There were no other significant findings  Description of operation:  Patient was taken to the operating room and She was then placed in the supine position with tilt to the left side. Rapid betadine prep was performed, suboptimal, and drape was placed.   adequate anesthetic level was obtained quickly and general anesthesia was not needed.s. A Pfannenstiel skin incision was made and carried down sharply to the rectus fascia which was scored in the midline extended laterally. The fascia was taken off the muscles both superiorly and without difficulty. The muscles were divided.  The peritoneal cavity was entered.  Bladder blade was placed, no bladder flap was created.  A low transverse hysterotomy incision was made and delivered a viable female  infant at 2052 with Apgars of 8 and 9 weighingpending lbs  oz.  Cord pH was obtained and was pending. The uterus was exteriorized. It was closed in 2 layers, the first being a running interlocking layer and the second being an imbricating layer using 0 monocryl on a CTX needle.  There was good resulting hemostasis. The uterus tubes and ovaries were all normal. Peritoneal cavity was irrigated vigorously. The muscles and peritoneum were reapproximated loosely. The fascia was closed using 0 Vicryl in running fashion. Subcutaneous tissue was made hemostatic and irrigated. The skin was closed using 4-0 Vicryl on a Keith needle in a subcuticular fashion.  Dermabond was placed for additional wound integrity and to serve as a barrier. Blood loss for the procedure was 425 cc. The patient received 2 gram of Ancef prophylactically. The patient was taken to the recovery room in good stable condition with all counts being correct x3. There was not time for a pre op count, RF wanding was clear and x ray was negative as well.  EBL 425 cc  Lazaro ArmsLuther H  10/11/2018 9:50 PM

## 2018-10-11 NOTE — Progress Notes (Signed)
Katelyn [name] is a 32 y.o. G1P0000 at 5584w0d admitted for induction of labor due to Post dates. Due date 10/04/18.  Subjective: Mom resting in the bed, rates her pain during contractions at a 5/10. Repositioned to the birth ball.  Objective: BP 120/75   Pulse 68   Temp 97.6 F (36.4 C) (Axillary)   Resp 18   Ht 5\' 3"  (1.6 m)   Wt 96.6 kg   LMP 12/28/2017 (Exact Date)   BMI 37.71 kg/m  No intake/output data recorded.  FHT:  FHR: 155 bpm, variability: moderate,  accelerations:  Present,  decelerations:  Absent UC:   regular, every 1-3 minutes SVE:   Dilation: 5 Effacement (%): 80 Station: -1 Exam by:: Oletta Darter. Craft, RN  Labs: Lab Results  Component Value Date   WBC 13.2 (H) 10/11/2018   HGB 12.6 10/11/2018   HCT 38.3 10/11/2018   MCV 86.7 10/11/2018   PLT 276 10/11/2018    Assessment / Plan: IOL for post-dates  Labor: Progressing with cytotec, will monitor for 1hr and reassess before starting Pitocin Preeclampsia:  no signs or symptoms of toxicity Fetal Wellbeing:  Category I Pain Control:  Labor support without medications I/D:  n/a Anticipated MOD:  NSVD  Bernerd LimboJamilla R Lianny Molter, SNM 10/11/2018, 3:45 PM

## 2018-10-11 NOTE — OR Nursing (Signed)
No timeout done as patient case was Stat due to fetal bradycardia.  Pt is aware of what we are doing and consented verbally in room with Dr. Despina HiddenEure.

## 2018-10-11 NOTE — Anesthesia Preprocedure Evaluation (Signed)
Anesthesia Evaluation  Patient identified by MRN, date of birth, ID band Patient awake    Reviewed: Allergy & Precautions, H&P , NPO status , Patient's Chart, lab work & pertinent test results  Airway Mallampati: II  TM Distance: >3 FB Neck ROM: Full    Dental no notable dental hx. (+) Teeth Intact, Dental Advisory Given   Pulmonary neg pulmonary ROS,    Pulmonary exam normal breath sounds clear to auscultation       Cardiovascular negative cardio ROS Normal cardiovascular exam Rhythm:Regular Rate:Normal     Neuro/Psych negative neurological ROS  negative psych ROS   GI/Hepatic negative GI ROS, Neg liver ROS,   Endo/Other  negative endocrine ROS  Renal/GU negative Renal ROS     Musculoskeletal negative musculoskeletal ROS (+)   Abdominal (+) + obese,   Peds  Hematology negative hematology ROS (+)   Anesthesia Other Findings   Reproductive/Obstetrics (+) Pregnancy                             Lab Results  Component Value Date   WBC 13.2 (H) 10/11/2018   HGB 12.6 10/11/2018   HCT 38.3 10/11/2018   MCV 86.7 10/11/2018   PLT 276 10/11/2018    Anesthesia Physical Anesthesia Plan  ASA: III  Anesthesia Plan: Epidural   Post-op Pain Management:    Induction:   PONV Risk Score and Plan:   Airway Management Planned:   Additional Equipment:   Intra-op Plan:   Post-operative Plan:   Informed Consent: I have reviewed the patients History and Physical, chart, labs and discussed the procedure including the risks, benefits and alternatives for the proposed anesthesia with the patient or authorized representative who has indicated his/her understanding and acceptance.     Plan Discussed with:   Anesthesia Plan Comments:         Anesthesia Quick Evaluation

## 2018-10-11 NOTE — Progress Notes (Signed)
Katelyn [name] is a 32 y.o. G1P0000 at [redacted]w[redacted]d admitted for induction of labor due to Post dates. Due date 10/04/18.  Subjective: Patient resting in bed, no complaints, tolerated FB placement well.  Objective: BP 120/75   Pulse 68   Temp 97.6 F (36.4 C) (Axillary)   Resp 18   Ht 5\' 3"  (1.6 m)   Wt 96.6 kg   LMP 12/28/2017 (Exact Date)   BMI 37.71 kg/m  No intake/output data recorded.  FHT:  FHR: 130 bpm, variability: moderate,  accelerations:  Present,  decelerations:  Absent UC:   irregular, every 2-3 minutes SVE:   Dilation: 1.5 Effacement (%): 40 Station: -1, 0 Exam by:: Montez MoritaErin Hampton, RN   Labs: Lab Results  Component Value Date   WBC 13.2 (H) 10/11/2018   HGB 12.6 10/11/2018   HCT 38.3 10/11/2018   MCV 86.7 10/11/2018   PLT 276 10/11/2018    Assessment / Plan: IOL for post-dates  Labor: Responding well to Cytotec, FB placed Preeclampsia:  no signs or symptoms of toxicity Fetal Wellbeing:  Category I Pain Control:  Epidural I/D:  n/a Anticipated MOD:  NSVD  Bernerd LimboJamilla R Ariyan Brisendine, SNM 10/11/2018, 1:12 PM

## 2018-10-12 ENCOUNTER — Encounter (HOSPITAL_COMMUNITY): Payer: Self-pay | Admitting: Obstetrics & Gynecology

## 2018-10-12 LAB — RPR: RPR Ser Ql: NONREACTIVE

## 2018-10-12 LAB — CBC
HCT: 30.6 % — ABNORMAL LOW (ref 36.0–46.0)
Hemoglobin: 10.1 g/dL — ABNORMAL LOW (ref 12.0–15.0)
MCH: 28.5 pg (ref 26.0–34.0)
MCHC: 33 g/dL (ref 30.0–36.0)
MCV: 86.2 fL (ref 80.0–100.0)
NRBC: 0 % (ref 0.0–0.2)
PLATELETS: 244 10*3/uL (ref 150–400)
RBC: 3.55 MIL/uL — ABNORMAL LOW (ref 3.87–5.11)
RDW: 13.2 % (ref 11.5–15.5)
WBC: 21 10*3/uL — ABNORMAL HIGH (ref 4.0–10.5)

## 2018-10-12 NOTE — Addendum Note (Signed)
Addendum  created 10/12/18 1011 by Angela AdamWrinkle, Annjeanette Sarwar G, CRNA   Charge Capture section accepted, Sign clinical note

## 2018-10-12 NOTE — Progress Notes (Signed)
POSTPARTUM PROGRESS NOTE  POD #1  Subjective:  Adonis HousekeeperMonica Vazquez Barajas is a 32 y.o. G1P1001 s/p stat LTCS at 2480w0d after prolonged fetal bradycardia. No acute events overnight. She reports she is doing well. She denies any problems with ambulating, voiding or po intake. Denies nausea or vomiting. She has not passed flatus. Pain is well controlled.  Lochia is mild.  Objective: Blood pressure 110/67, pulse 83, temperature 97.8 F (36.6 C), temperature source Oral, resp. rate 18, height 5\' 3"  (1.6 m), weight 96.6 kg, last menstrual period 12/28/2017, SpO2 98 %, unknown if currently breastfeeding.  Physical Exam:  General: alert, cooperative and no distress Chest: no respiratory distress Heart:regular rate, distal pulses intact Abdomen: soft, nontender,  Uterine Fundus: firm, appropriately tender DVT Evaluation: No calf swelling or tenderness Extremities: no edema, SCD's in place Skin: warm, dry; incision clean/dry/intact w/ honeycomb dressing in place  Recent Labs    10/11/18 0824 10/12/18 0656  HGB 12.6 10.1*  HCT 38.3 30.6*    Assessment/Plan: Adonis HousekeeperMonica Vazquez Barajas is a 32 y.o. G1P1001 s/p stat LTCS for prolonged fetal brady at 9280w0d.  POD#1 - Doing welll; pain well controlled. H/H appropriate  Routine postpartum care  OOB, ambulated  Lovenox for VTE prophylaxis Anemia: asymptomatic  Contraception: IUD Feeding: breast  Dispo: Plan for discharge in 2 days.   LOS: 1 day   [place]Jordan Rosemaria Inabinet, DO PGY-2, [facility]  10/12/2018, 8:08 AM

## 2018-10-12 NOTE — Anesthesia Postprocedure Evaluation (Signed)
Anesthesia Post Note  Patient: Katelyn MerinoMonica Vazquez Barajas  Procedure(s) Performed: CESAREAN SECTION (N/A )     Patient location during evaluation: Mother Baby Anesthesia Type: Epidural Level of consciousness: awake and alert, oriented and patient cooperative Pain management: pain level controlled Vital Signs Assessment: post-procedure vital signs reviewed and stable Respiratory status: spontaneous breathing Cardiovascular status: stable Postop Assessment: no headache, epidural receding, patient able to bend at knees and no signs of nausea or vomiting Anesthetic complications: no Comments: Pain score 1.     Last Vitals:  Vitals:   10/12/18 0505 10/12/18 0838  BP: 110/67 (!) 100/54  Pulse: 83 62  Resp: 18 18  Temp:  36.7 C  SpO2: 98% 97%    Last Pain:  Vitals:   10/12/18 0838  TempSrc: Oral  PainSc:    Pain Goal:                 [facility] HospitalWRINKLE,Shaheen Mende

## 2018-10-12 NOTE — Lactation Note (Signed)
This note was copied from a baby's chart. Lactation Consultation Note  Patient Name: Katelyn [name] [id number] Date: 10/12/2018 Reason for consult: Initial assessment;Primapara;1st time breastfeeding;Term;Other (Comment)(DAT (+))  70 hours old FT female who is being exclusively BF by her mother, she's a P1. Mom took BF classes at the [facility] office in the [facility]. LC reviewed hand expression with mom and she was able to get colostrum easily. Baby is DAT (+) LC made mom aware of it, she didn't know, mom is a [place] speaker. She understands that if supplementation were to be needed at some point her own EBM will be our first choice, she really wants to BF.  Baby was having her hearing test when entering the room, she was very congested and snorty, NT had to stop the test because baby started crying due to her congestion. She has had her bath so mom put her STS, but baby started rooting for the breast. Mom decided to latch her on, LC assisted mom and baby fed off both breasts in cross cradle positions without any breastfeeding tools or nipple shield for a total of 20 minutes. A few audible swallows were noted upon breast compressions, mom burped her in between breasts.  [place] set mom up with a DEBP, instructions, cleaning and storage were reviewed, as well as milk storage guidelines. LC also showed mom how to convert her DEBP kit into a hand pump since she doesn't have one at home. Maternal GOB was the support person at the time, they both had a lot of questions about BF and lactation. Tips for a deep latch, BF basics, cluster feeding, and pumping were dicussed. LC also showed them how to finger feed baby.  Mom brought lanolin from home. Edna asked her RN for coconut oil instead and discuss with mom why we no longer use lanolin at the hospital. Mom also requested snappies to collect her EBM even though she understands that pumping at this stage is mainly for breast stimulation and she's not supposed to get  volume yet.  Feeding plan:  1. Encouraged mom to feed baby STS 8-12 times/24 hours or sooner if feeding cues are present 2. Hand expression and finger/spoon feeding was also encouraged 3. Mom will pump every 3 hours after feedings, at least 6 pumping sessions/24 hours  BF brochure (SP), BF resources and feeding diary (SP) were reviewed. Mom reported all questions and concerns were answered, she's aware of [facility] services and will call PRN.  Maternal Data Formula Feeding for Exclusion: No Has patient been taught Hand Expression?: Yes Does the patient have breastfeeding experience prior to this delivery?: No  Feeding Feeding Type: Breast Fed  LATCH Score Latch: Grasps breast easily, tongue down, lips flanged, rhythmical sucking.(without NS)  Audible Swallowing: A few with stimulation(without NS and with breast compressions)  Type of Nipple: Everted at rest and after stimulation  Comfort (Breast/Nipple): Filling, red/small blisters or bruises, mild/mod discomfort(only the left one, right one is intact)  Hold (Positioning): Assistance needed to correctly position infant at breast and maintain latch.  LATCH Score: 7  Interventions Interventions: Breast feeding basics reviewed;Assisted with latch;Skin to skin;Breast massage;Hand express;Breast compression;Adjust position;Support pillows;DEBP;Coconut oil  Lactation Tools Discussed/Used Tools: Pump;Coconut oil Breast pump type: Double-Electric Breast Pump WIC Program: Yes Pump Review: Setup, frequency, and cleaning;Milk Storage Initiated by:: MPeck Date initiated:: 10/12/18   Consult Status Consult Status: Follow-up Date: 10/13/18 Follow-up type: In-patient    Cristofher Livecchi Francene Boyers 10/12/2018, 1:23 PM

## 2018-10-13 ENCOUNTER — Other Ambulatory Visit: Payer: Self-pay

## 2018-10-13 DIAGNOSIS — Z98891 History of uterine scar from previous surgery: Secondary | ICD-10-CM

## 2018-10-13 NOTE — Progress Notes (Addendum)
Rn used in house interpreter earlier in the evening and then used stratus video after 2300 numbers [id number] and [id number].

## 2018-10-13 NOTE — Lactation Note (Signed)
This note was copied from a baby's chart. Lactation Consultation Note  Patient Name: [name] [id number]'s Date: 10/13/2018 Reason for consult: Follow-up assessment;Difficult latch;Term  P1 mother whose infant is now 6336 hours old.  Spanish interpreter Dianna, [id number], used for interpretation.  Baby was crying and being held by grandmother when I arrived; mother was in the bathroom.  Mother stated that baby was breast feeding but she fell asleep.  Mother thought she was finished feeding.  Educated mother on how to know if baby is finished feeding by reviewing feeding cues and observing baby.  Explained how crying is a late sign of hunger and it is harder to latch when baby is upset and crying.    I allowed baby to suck on my gloved finger while mother positioned herself appropriately.  Baby was dressed and swaddled.  Explained the importance of doing STS while breast feeding and mother gave permission to undress baby.  Assisted baby to latch in the football hold on the left breast without difficulty.  Demonstrated breast compressions and deep latching.  Showed mother how to observe for flanged lips and audible swallows.  Baby began sucking rhythmically and many swallows noted.  Mother denied pain.  Baby became restless after 10 minutes and self released.  She burped and I assisted back to the breast without difficulty.  She was still feeding when I left the room.  Encouraged mother to call for latch assistance as needed.  Mother will continue feeding 8-12 times/24 hours or sooner if baby shows cues.  Mother will feed STS and continue breast compressions.  Her support person is her mother and she has been assisting also with latching.     Maternal Data Formula Feeding for Exclusion: No Has patient been taught Hand Expression?: Yes Does the patient have breastfeeding experience prior to this delivery?: No  Feeding Feeding Type: Breast Fed  LATCH Score Latch: Grasps breast  easily, tongue down, lips flanged, rhythmical sucking.  Audible Swallowing: Spontaneous and intermittent  Type of Nipple: Everted at rest and after stimulation  Comfort (Breast/Nipple): Soft / non-tender  Hold (Positioning): Assistance needed to correctly position infant at breast and maintain latch.  LATCH Score: 9  Interventions Interventions: Breast feeding basics reviewed;Assisted with latch;Skin to skin;Breast massage;Hand express;Breast compression;Adjust position;Position options;Support pillows  Lactation Tools Discussed/Used WIC Program: Yes   Consult Status Consult Status: Follow-up Date: 10/14/18 Follow-up type: In-patient    Jaquita Bessire R Kirkland Figg 10/13/2018, 9:47 AM

## 2018-10-13 NOTE — Discharge Summary (Signed)
Postpartum Discharge Summary     Patient Name: Katelyn [name] DOB: [birth year] MRN: [id number]  Date of admission: 10/11/2018 Delivering Provider: Lazaro Arms   Date of discharge: 10/13/2018  Admitting diagnosis: 32wks Induction Intrauterine pregnancy: [redacted]w[redacted]d     Secondary diagnosis:  Active Problems:   Indication for care in labor and delivery, antepartum   S/P primary low transverse C-section  Additional problems: none     Discharge diagnosis: Term Pregnancy Delivered                                                                                                Post partum procedures:none  Augmentation: Pitocin, Cytotec and Foley Balloon  Complications: None  Hospital course:  Induction of Labor With Cesarean Section  32 y.o. yo G1P1001 at [redacted]w[redacted]d was admitted to the hospital 10/11/2018 for induction of labor. Patient had a labor course significant for none. The patient went for cesarean section due to Non-Reassuring FHR, and delivered a Viable infant,10/11/2018  Membrane Rupture Time/Date: 5:20 PM ,10/11/2018   Details of operation can be found in separate operative Note.  Patient had an uncomplicated postpartum course. She is ambulating, tolerating a regular diet, passing flatus, and urinating well.  Patient is discharged home in stable condition on 10/13/18.                                    Magnesium Sulfate recieved: No BMZ received: No  Physical exam  Vitals:   10/12/18 1327 10/12/18 1651 10/12/18 2016 10/13/18 0447  BP: 104/67 (!) 99/57 106/61 (!) 89/55  Pulse: 61 (!) 55 65 (!) 57  Resp: 18 18 18    Temp: 98.4 F (36.9 C) 99 F (37.2 C) 98.6 F (37 C) 98.3 F (36.8 C)  TempSrc: Oral Oral Oral Oral  SpO2: 98% 97% 98%   Weight:      Height:       General: alert, cooperative and no distress Lochia: appropriate Uterine Fundus: firm Incision: Dressing is clean, dry, and intact DVT Evaluation: No evidence of DVT seen on physical exam. Negative  Homan's sign. No cords or calf tenderness. No significant calf/ankle edema. Labs: Lab Results  Component Value Date   WBC 21.0 (H) 10/12/2018   HGB 10.1 (L) 10/12/2018   HCT 30.6 (L) 10/12/2018   MCV 86.2 10/12/2018   PLT 244 10/12/2018   No flowsheet data found.  Discharge instruction: per After Visit Summary and "Baby and Me Booklet".  After visit meds:    Diet: routine diet  Activity: Advance as tolerated. Pelvic rest for 6 weeks.   Outpatient follow up:2 weeks Follow up Appt:No future appointments. Follow up Visit: Follow-up Information    [facility] Follow up in 2 week(s).   Specialty:  Obstetrics and Gynecology Why:  For incision check Contact information: [address] [place] [address] [phone]           Please schedule this patient for Postpartum visit in: 6 weeks with the following provider: Any  provider For C/S patients schedule nurse incision check in weeks 2 weeks: yes Low risk pregnancy complicated by: n/a Delivery mode:  CS Anticipated Birth Control:  IUD PP Procedures needed: Incision check  Schedule Integrated BH visit: no      Newborn Data: Live born female  Birth Weight: 6 lb 0.5 oz (2735 g) APGAR: 8, 9  Newborn Delivery   Birth date/time:  10/11/2018 20:52:00 Delivery type:  C-Section, Low Transverse Trial of labor:  Yes C-section categorization:  Primary     Baby Feeding: Breast Disposition:home with mother  Addendum: Pt POD#2 and initially interested in discharge today.  During education/evaluation, pt became unsure about discharge today.  Will notify RN if she desires to leave by this afternoon.    10/13/2018 Sharen CounterLisa Leftwich-Kirby, CNM

## 2018-10-14 MED ORDER — POLYETHYLENE GLYCOL 3350 17 G PO PACK: PACK | ORAL | 0 refills | Status: AC

## 2018-10-14 MED ORDER — IBUPROFEN 600 MG PO TABS: 600.0000 mg | ORAL_TABLET | Freq: Four times a day (QID) | ORAL | 0 refills | Status: AC

## 2018-10-14 MED ORDER — SENNOSIDES-DOCUSATE SODIUM 8.6-50 MG PO TABS: 2.0000 | ORAL_TABLET | ORAL | 0 refills | Status: AC

## 2018-10-14 MED ORDER — OXYCODONE-ACETAMINOPHEN 5-325 MG PO TABS: 1.0000 | ORAL_TABLET | Freq: Four times a day (QID) | ORAL | 0 refills | Status: AC | PRN

## 2018-10-14 NOTE — Progress Notes (Signed)
CSW received consult due to score 15 on Edinburgh Depression Screen.   CSW completed assessment with MOB using stratus video interpreting representative Diana #[id number]. MOB was accompanied by family member and granted CSW verbal permission to speak in front of family. CSW introduced self and explained role. CSW inquired about MOB mental health hx, MOB reported none.   CSW provided education regarding Baby Blues vs PMADs and provided MOB with resources for mental health follow up.  CSW encouraged MOB to evaluate her mental health throughout the postpartum period with the use of the New Mom Checklist (Spanish version) developed by Postpartum Progress as well as the Edinburgh Postnatal Depression Scale and notify a medical professional if symptoms arise.  MOB replied okay.  CSW inquired about MOB support system, MOB reports that she has supports she just wish her mom was here. CSW validated her feelings. MOB reported that she has all essential items to care for baby. MOB asked for information about medicaid for baby, CSW informed MOB about DSS/medicaid and provided her with contact information for [place] [facility] medicaid. CSW inquired if patient needed any additional resources, MOB reported none and thanked CSW.   CSW signing off, no barriers to discharge.   Katelyn [name], LCSWA Clinical Social Worker [facility] Cell#: [phone]           

## 2018-10-14 NOTE — Lactation Note (Signed)
This note was copied from a baby's chart. Lactation Consultation Note  Patient Name: Katelyn [name] Katelyn [name]: 10/14/2018 Reason for consult: Follow-up assessment;Difficult latch;Term  Spanish interpreter, [phone], used for interpretation.   P1 mother whose infant is now 4564 hours old.  Baby will not be discharged today per pediatrician's orders.  Baby will continue breast feeding and supplementing after feedings.  Mother's breasts are filling and the left breast is slightly firm.  Demonstrated breast massage and encouraged mother to massage prior to latching.  Both breasts have small bruised areas from poor latching.  Baby was fussy when I arrived. Mother stated that baby had breast fed about an hour ago and supplemented with 10 mls of formula.  I offered to assist with latching and she accepted.  Assisted to latch on the left breast in the cross cradle hold.  Baby would take a few good sucks and then became fussy. Burped and assisted to latch again on the left breast in the same hold.  She continued to be restless so tried the right breast in the football hold.  She continued to be fussy and would not latch for more than a few sucks.  Audible swallows noted with sucking.  Baby nippled another 10 mls formula after latching and was content.  Encouraged mother to continue feeding 8-12 times/24 hours or sooner if baby shows cues.  Awaken baby to feed at 3 hours if she does self awaken.  Continue to do breast compressions with feedings and to post pump for 15 minutes after feeding.  Reviewed milk storage times with mother.  Mother had just started pumping today.  Comfort gels with instructions for use given to mother.  Grandmother is her support person and is present and willing to help.  RN updated.    Maternal Data Formula Feeding for Exclusion: No Has patient been taught Hand Expression?: Yes Does the patient have breastfeeding experience prior to this delivery?:  No  Feeding Feeding Type: Breast Fed  LATCH Score Latch: Repeated attempts needed to sustain latch, nipple held in mouth throughout feeding, stimulation needed to elicit sucking reflex.  Audible Swallowing: Spontaneous and intermittent  Type of Nipple: Everted at rest and after stimulation  Comfort (Breast/Nipple): Filling, red/small blisters or bruises, mild/mod discomfort  Hold (Positioning): Assistance needed to correctly position infant at breast and maintain latch.  LATCH Score: 7  Interventions Interventions: Breast feeding basics reviewed;Assisted with latch;Skin to skin;Breast massage;Hand express;Breast compression;Adjust position;DEBP;Expressed milk;Position options;Support pillows  Lactation Tools Discussed/Used WIC Program: Yes Pump Review: Setup, frequency, and cleaning;Milk Storage(Reviewed)   Consult Status Consult Status: Follow-up [name]: 10/15/18 Follow-up type: In-patient    Lennex Pietila R Ethie Curless 10/14/2018, 1:37 PM

## 2018-10-14 NOTE — Discharge Summary (Signed)
Obstetrics Discharge Summary OB/[facility]N Faculty [facility]   Patient Name: Katelyn [name] DOB: 1986/05/09 MRN: [id number]  Date of admission: 10/11/2018 Delivering MD: Lazaro ArmsEURE, LUTHER H   Date of discharge: 10/14/2018  Admitting diagnosis: 41wks Induction Intrauterine pregnancy: 2843w0d     Secondary diagnosis:   Active Problems:   Indication for care in labor and delivery, antepartum   S/P primary low transverse C-section    Discharge diagnosis: primary LTCS for fetal bradycardia                                          Postpartum procedures: None  Complications: need for Cesarean delivery because of non-reassuring fetal status   Outpatient Follow-Up: [ ]  incision check in 10-14 days [ ]  IUD postpartum visit   *In-person Spanish interpreter used for visit* Hospital course: Katelyn [name] is a 32 y.o. 1843w0d who was admitted for post-dates induction of labor. Her pregnancy was uncomplicated. Her labor course was notable for fetal bradycardia resulted in cesarean section. Delivery was uncomplicated, APGARs 8 and 9. Please see delivery/op note for additional details. Her postpartum course was uncomplicated. She was breastfeeding with lactation assistance and also pumping. By day of discharge, she was passing flatus, urinating, eating and drinking without difficulty. Her pain was well-controlled, and she was discharged home with ibuprofen and percocet 5-325mg  every 6 hours prn #20. She will follow-up in clinic in 1-2 weeks for incision check and then in 4-6 weeks for postpartum visit .   Physical exam  Vitals:   10/13/18 0447 10/13/18 1412 10/13/18 2258 10/14/18 0534  BP: (!) 89/55 (!) 108/58 111/70 120/62  Pulse: (!) 57 65 68 61  Resp:  19  18  Temp: 98.3 F (36.8 C) 98.3 F (36.8 C) 98.2 F (36.8 C) 98 F (36.7 C)  TempSrc: Oral Oral Oral Oral  SpO2:  100%  100%  Weight:      Height:       General: well-appearing, NAD Lochia: appropriate Uterine Fundus:  firm Incision: Dressing is clean, dry, and intact  DVT Evaluation: No significant calf/ankle edema. Labs: Lab Results  Component Value Date   WBC 21.0 (H) 10/12/2018   HGB 10.1 (L) 10/12/2018   HCT 30.6 (L) 10/12/2018   MCV 86.2 10/12/2018   PLT 244 10/12/2018   No flowsheet data found.  Discharge instructions: Per After Visit Summary and "Baby and Me Booklet"  After visit meds:  Allergies as of 10/14/2018   No Known Allergies     Medication List    TAKE these medications   ibuprofen 600 MG tablet Commonly known as:  ADVIL,MOTRIN Take 1 tablet (600 mg total) by mouth every 6 (six) hours.   multivitamin-prenatal 27-0.8 MG Tabs tablet Take 1 tablet by mouth daily at 12 noon.   oxyCODONE-acetaminophen 5-325 MG tablet Commonly known as:  PERCOCET/ROXICET Take 1 tablet by mouth every 6 (six) hours as needed for up to 5 days for severe pain.   polyethylene glycol packet Commonly known as:  MIRALAX / GLYCOLAX Mix 1 packet in beverage of choice nightly to help with regular bowel movements. Do not take if diarrhea.   senna-docusate 8.6-50 MG tablet Commonly known as:  Senokot-S Take 2 tablets by mouth daily. Start taking on:  10/15/2018       Postpartum contraception: IUD Diet: Routine Diet Activity: Advance as tolerated. Pelvic rest for 6 weeks.  Follow-up Appt:No future appointments. Follow-up Visit:No follow-ups on file.  Newborn Data: Live born female  Birth Weight: 6 lb 0.5 oz (2735 g) APGAR: 8, 9  Newborn Delivery   Birth date/time:  10/11/2018 20:52:00 Delivery type:  C-Section, Low Transverse Trial of labor:  Yes C-section categorization:  Primary     Baby Feeding: Breastfeeding, pumping Disposition:home with mother  Cristal Deer. Earlene Plater, DO OB/GYN Fellow, Faculty Practice

## 2018-10-15 ENCOUNTER — Encounter (HOSPITAL_COMMUNITY): Payer: Self-pay | Admitting: Obstetrics & Gynecology

## 2018-10-15 NOTE — Addendum Note (Signed)
Addendum  created 10/15/18 0904 by Trevor IhaHouser, Stephen A, MD   Intraprocedure Event edited

## 2018-10-25 ENCOUNTER — Ambulatory Visit (INDEPENDENT_AMBULATORY_CARE_PROVIDER_SITE_OTHER): Payer: Self-pay | Admitting: *Deleted

## 2018-10-25 VITALS — BP 114/78 | HR 56 | Wt 197.8 lb

## 2018-10-25 DIAGNOSIS — Z5189 Encounter for other specified aftercare: Secondary | ICD-10-CM

## 2018-10-25 NOTE — Progress Notes (Signed)
Pt here for incision check.  Pt 2 weeks s/p c/s on 10/11/18.  Incision is approximated without any signs of infection.  Pt advised that there is still some skin glue on the incision that she can remove as it loosens.  Pt instructed not to remove the glue if she has to use force.  Pt denies any fevers or other s/s of infection.  Reviewed the s/s of infection with the pt.  Pt advised to follow up at her postpartum visit.  Pt verbalized understanding of all teaching.

## 2018-10-26 NOTE — Progress Notes (Signed)
I have reviewed the chart and agree with nursing staff's documentation of this patient's encounter.  Anik Wesch, MD 10/26/2018 8:23 AM    

## 2018-11-18 ENCOUNTER — Encounter: Payer: Self-pay | Admitting: Family Medicine

## 2018-11-18 ENCOUNTER — Ambulatory Visit (INDEPENDENT_AMBULATORY_CARE_PROVIDER_SITE_OTHER): Payer: Self-pay | Admitting: Family Medicine

## 2018-11-18 NOTE — Progress Notes (Signed)
Subjective:     Adonis HousekeeperMonica Vazquez Barajas is a 32 y.o. female who presents for a postpartum visit. She is 5 weeks postpartum following a low cervical transverse Cesarean section. I have fully reviewed the prenatal and intrapartum course. The delivery was at 41 gestational weeks. Outcome: primary cesarean section, low transverse incision. Anesthesia: epidural. Postpartum course has been normal. Baby's course has been normal. Baby is feeding by breast and bottle / Rush BarerGerber . Bleeding staining only. Bowel function is abnormal: using miralax and senna as needed. Bladder function is normal. Patient is not sexually active. Contraception method is none. Postpartum depression screening: negative.  The following portions of the patient's history were reviewed and updated as appropriate: allergies, current medications, past family history, past medical history, past social history, past surgical history and problem list.  Review of Systems Pertinent items are noted in HPI.   Objective:    LMP 12/28/2017 (Exact Date)   General:  alert, cooperative and no distress  Lungs: clear to auscultation bilaterally  Heart:  regular rate and rhythm, S1, S2 normal, no murmur, click, rub or gallop  Abdomen: soft, non-tender; bowel sounds normal; no masses,  no organomegaly. Incision clean, dry, healing well.        Assessment:     Normal postpartum exam. Pap smear not done at today's visit.   Plan:    1. Contrace[facility]: [facility]. Follow up in: 1 year or as needed.

## 2018-11-26 ENCOUNTER — Encounter: Payer: Self-pay | Admitting: *Deleted

## 2019-08-09 ENCOUNTER — Other Ambulatory Visit: Payer: Self-pay

## 2019-08-09 DIAGNOSIS — Z20822 Contact with and (suspected) exposure to covid-19: Secondary | ICD-10-CM

## 2019-08-10 LAB — NOVEL CORONAVIRUS, NAA: SARS-CoV-2, NAA: NOT DETECTED
